# Patient Record
Sex: Female | Born: 1950 | Race: White | Hispanic: No | Smoking: Never smoker
Health system: Southern US, Community
[De-identification: ages and names within clinical notes are randomized; demographics above are authoritative.]

## PROBLEM LIST (undated history)

## (undated) DIAGNOSIS — F329 Major depressive disorder, single episode, unspecified: Secondary | ICD-10-CM

## (undated) DIAGNOSIS — E079 Disorder of thyroid, unspecified: Secondary | ICD-10-CM

## (undated) DIAGNOSIS — M797 Fibromyalgia: Secondary | ICD-10-CM

## (undated) DIAGNOSIS — E559 Vitamin D deficiency, unspecified: Secondary | ICD-10-CM

## (undated) DIAGNOSIS — D649 Anemia, unspecified: Secondary | ICD-10-CM

## (undated) DIAGNOSIS — Z889 Allergy status to unspecified drugs, medicaments and biological substances status: Secondary | ICD-10-CM

## (undated) DIAGNOSIS — E538 Deficiency of other specified B group vitamins: Secondary | ICD-10-CM

## (undated) DIAGNOSIS — R5383 Other fatigue: Secondary | ICD-10-CM

## (undated) DIAGNOSIS — H9190 Unspecified hearing loss, unspecified ear: Secondary | ICD-10-CM

## (undated) DIAGNOSIS — G629 Polyneuropathy, unspecified: Secondary | ICD-10-CM

## (undated) DIAGNOSIS — F419 Anxiety disorder, unspecified: Secondary | ICD-10-CM

## (undated) DIAGNOSIS — F32A Depression, unspecified: Secondary | ICD-10-CM

## (undated) DIAGNOSIS — E039 Hypothyroidism, unspecified: Secondary | ICD-10-CM

## (undated) HISTORY — DX: Vitamin D deficiency, unspecified: E55.9

## (undated) HISTORY — DX: Other fatigue: R53.83

## (undated) HISTORY — DX: Hypothyroidism, unspecified: E03.9

## (undated) HISTORY — DX: Anemia, unspecified: D64.9

## (undated) HISTORY — DX: Fibromyalgia: M79.7

## (undated) HISTORY — DX: Deficiency of other specified B group vitamins: E53.8

## (undated) HISTORY — DX: Anxiety disorder, unspecified: F41.9

## (undated) HISTORY — PX: BACK SURGERY: SHX140

---

## 1998-03-19 ENCOUNTER — Emergency Department (HOSPITAL_COMMUNITY): Admission: EM | Admit: 1998-03-19 | Discharge: 1998-03-19 | Payer: Self-pay | Admitting: Emergency Medicine

## 2005-02-09 ENCOUNTER — Emergency Department (HOSPITAL_COMMUNITY): Admission: EM | Admit: 2005-02-09 | Discharge: 2005-02-09 | Payer: Self-pay | Admitting: Emergency Medicine

## 2006-04-21 IMAGING — CR DG ANKLE COMPLETE 3+V*R*
3 series · 3 of 3 positions shown · non-contrast
Comparison: none

CLINICAL DATA: Fell off ladder and landed on right heel with pain in the right heel and lateral ankle.
 RIGHT ANKLE ? 3 VIEWS:
 Comminuted fracture of the calcaneus, mainly the mid aspect and plantar aspect of the posterior tuberosity.  The fracture line extends to the posterior subtalar joint and plantar surface of the posterior tuberosity.

[t ankle joint ap right]
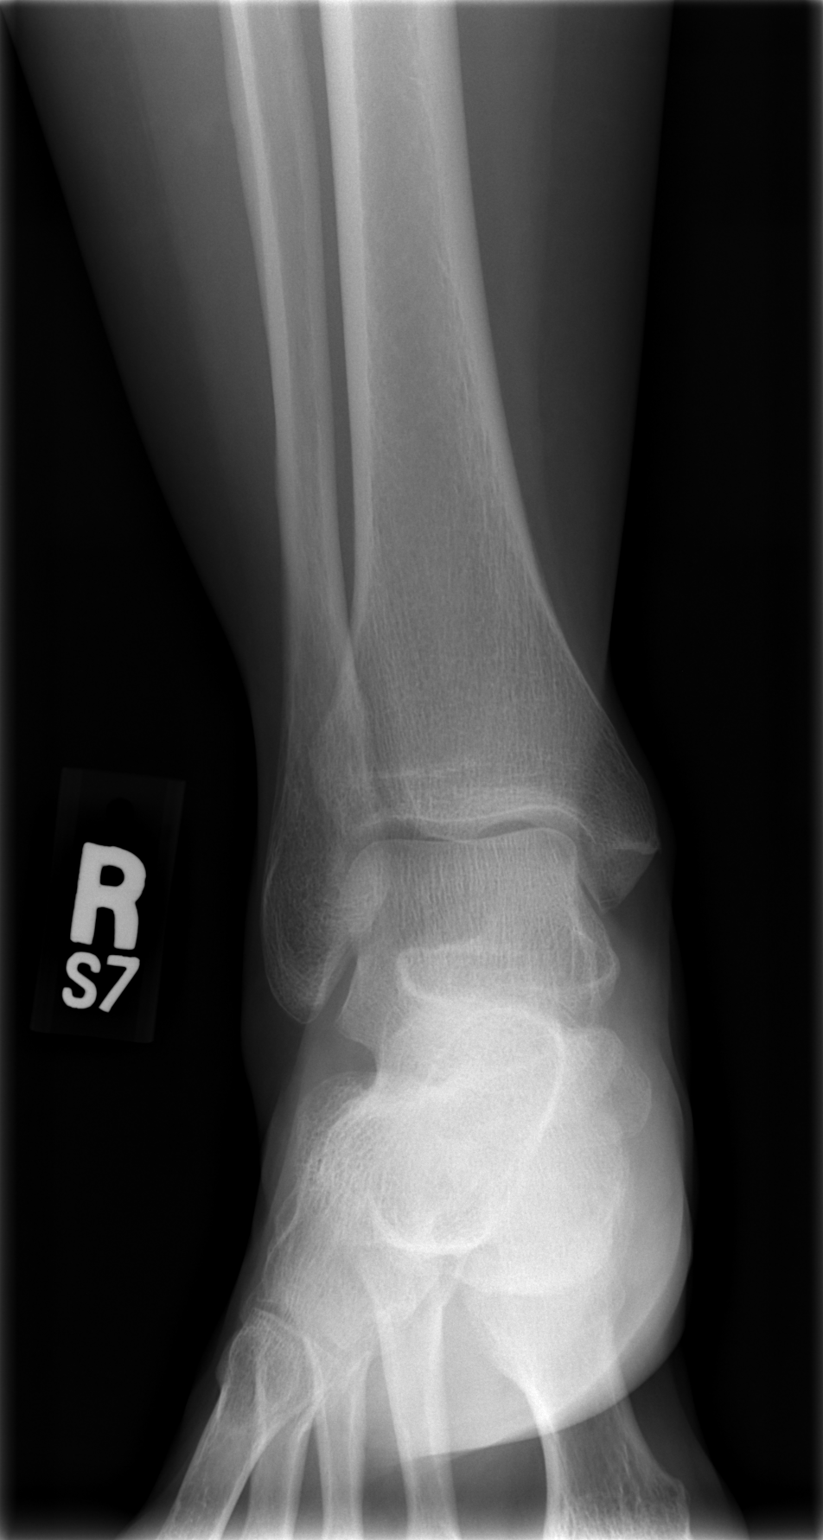

[t ankle joint oblique right]
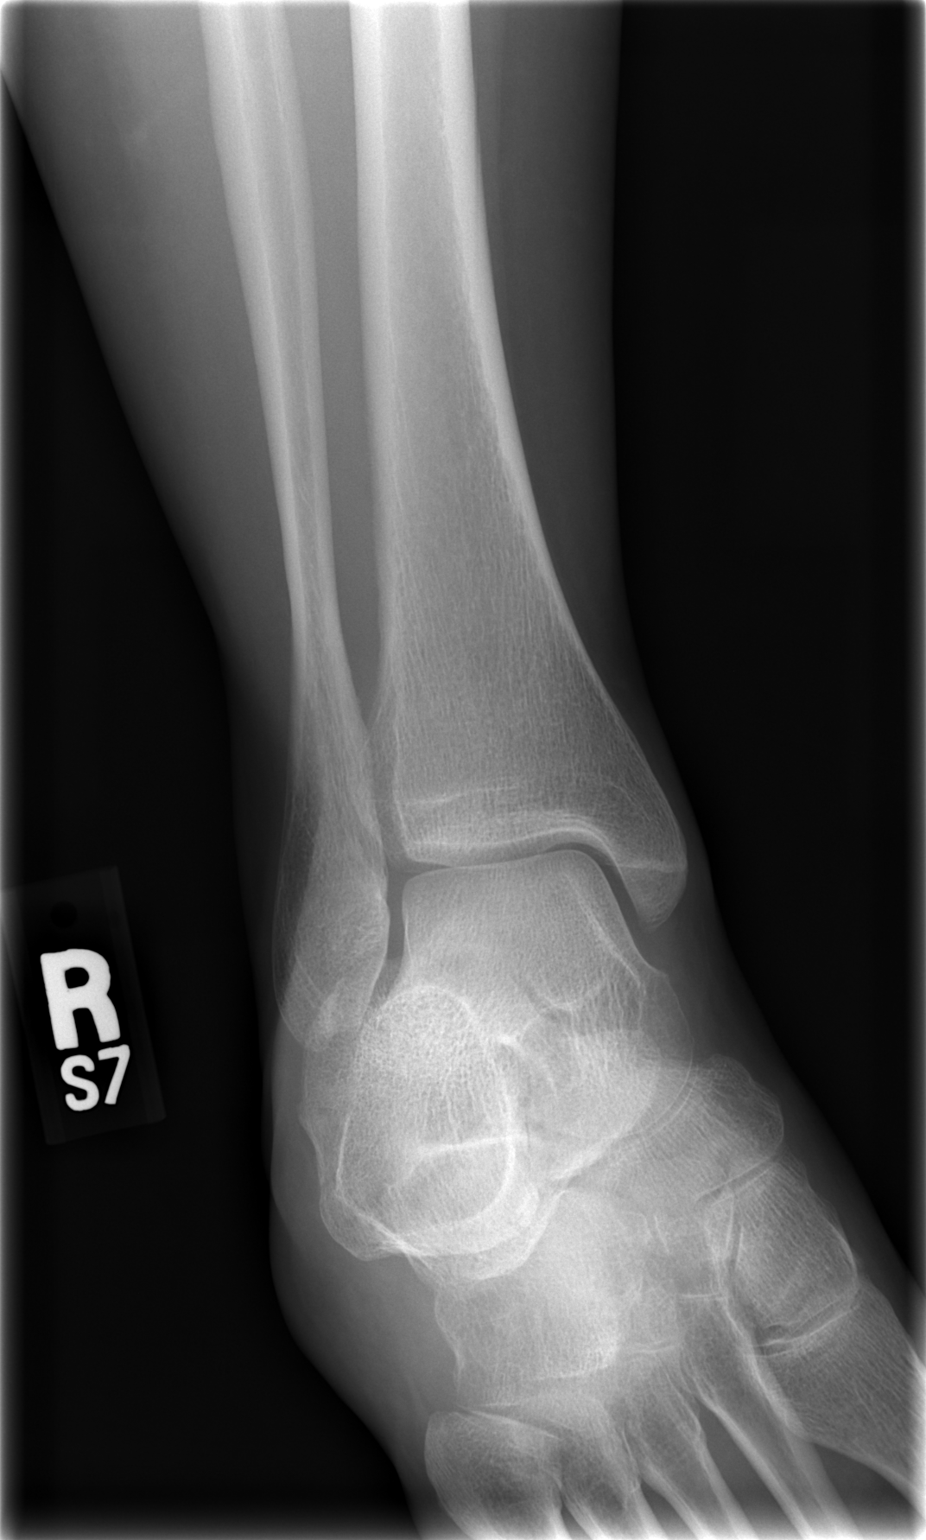

[t ankle joint lat right]
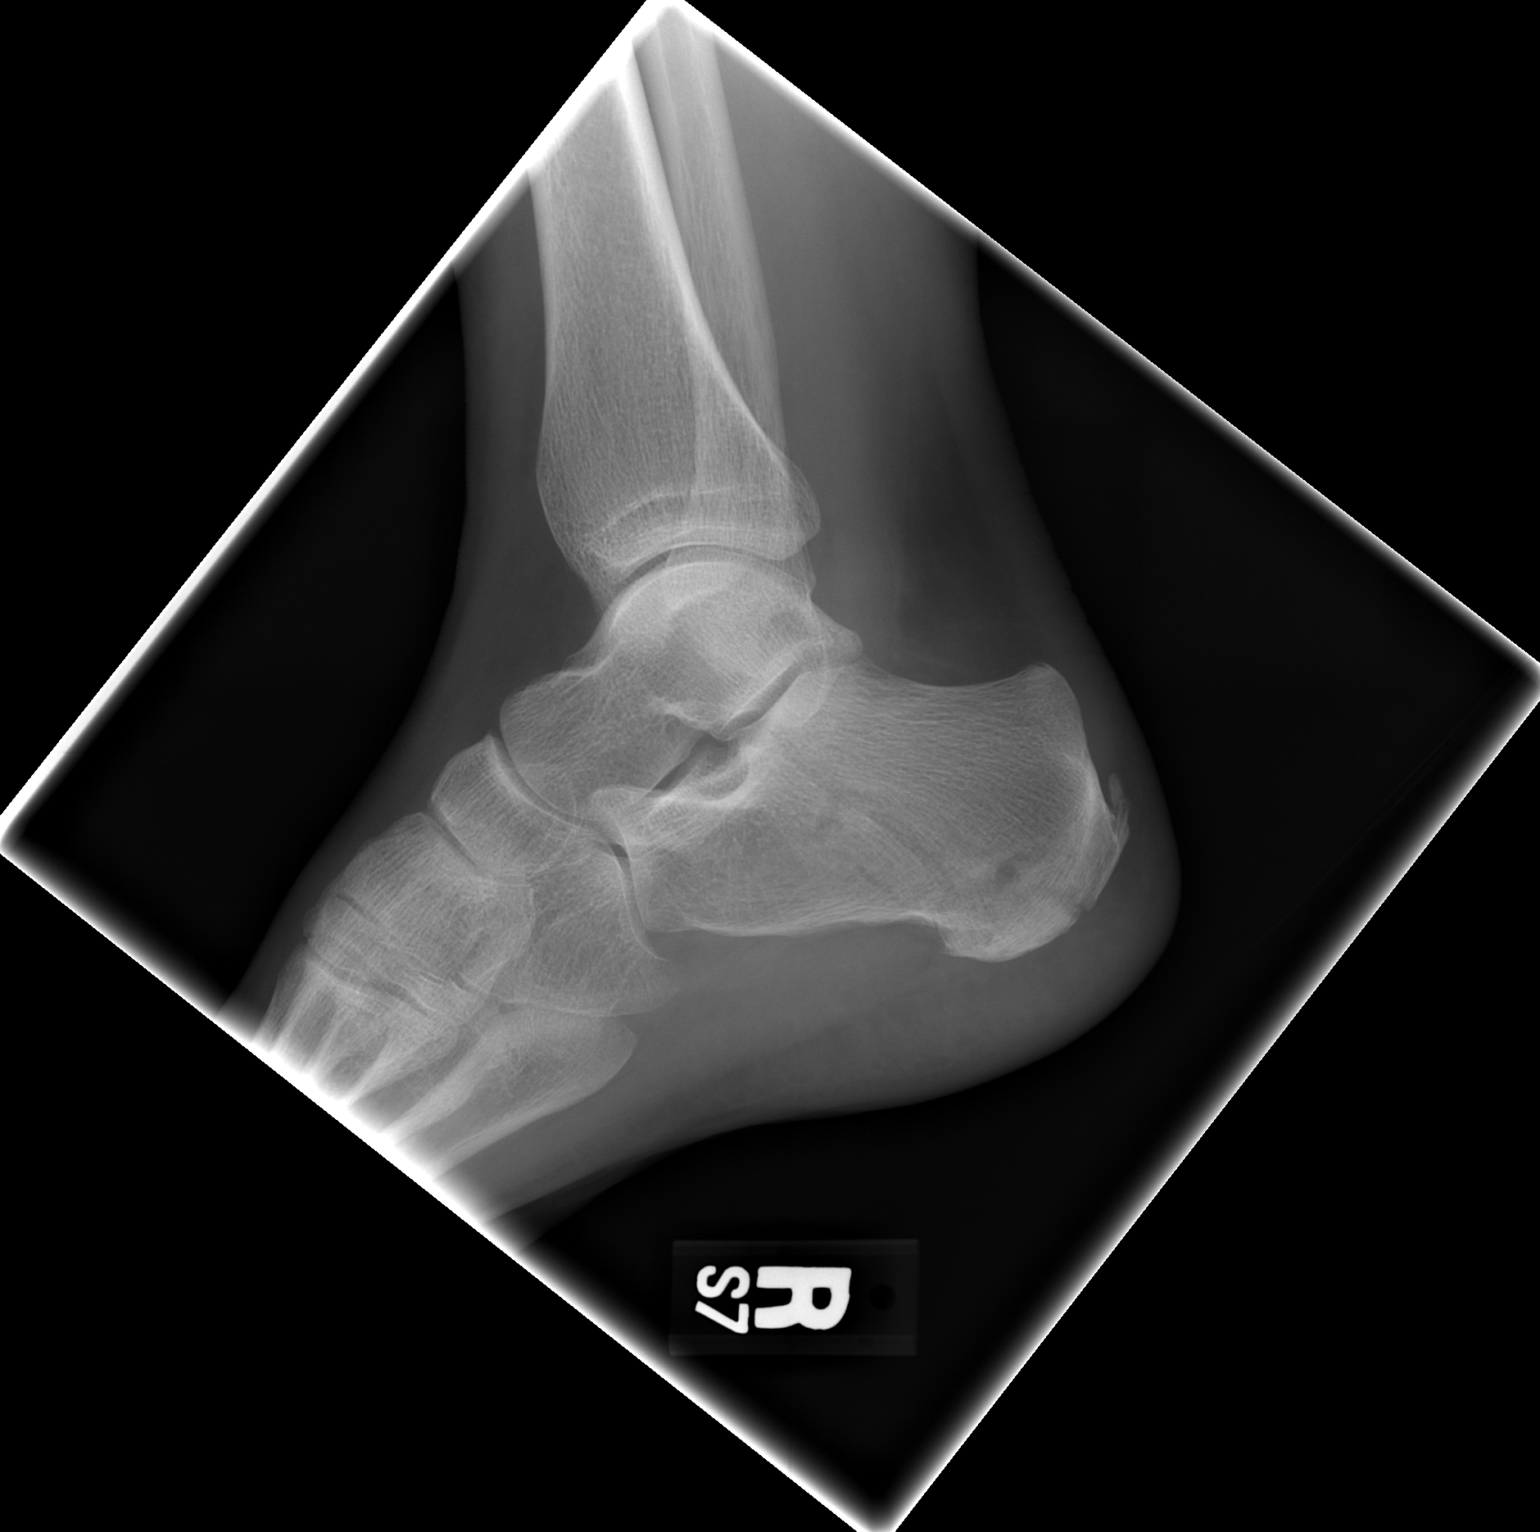

[3 of 3 positions shown; findings below may reference images not displayed]

IMPRESSION: Right calcaneal fracture.

## 2006-04-21 IMAGING — CT CT EXTREM LOW W/O CM*R*
2 of 3 series · 12 of 20 positions shown, 15 images · IV contrast (agent unspecified)
Comparison: none

CLINICAL DATA: Fall, calcaneal fracture. 
CT OF THE RIGHT FOOT WITHOUT CONTRAST:
Axial scans were obtained through the foot with multiplanar reconstruction.  
There is a fracture through the calcaneus.  This is mildly comminuted on the dorsal aspect of the calcaneus but did not significantly displaced.  The fracture extends anteriorly into the subtalar joint but is not significantly displaced.  The ankle joint and the talus are intact.

[Series 5: rt. os calsis 1.0 b80s · axial · 0.29mm/px · z∈[+206,+326]mm · 9 of 301 slices shown, 12 images]
[im 31/301  soft-tissue]
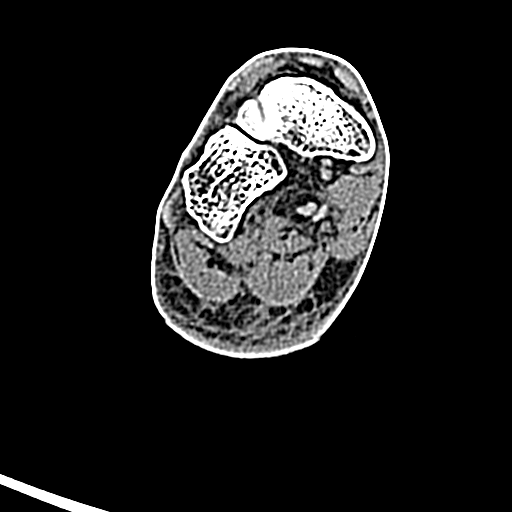
[im 31/301  bone]
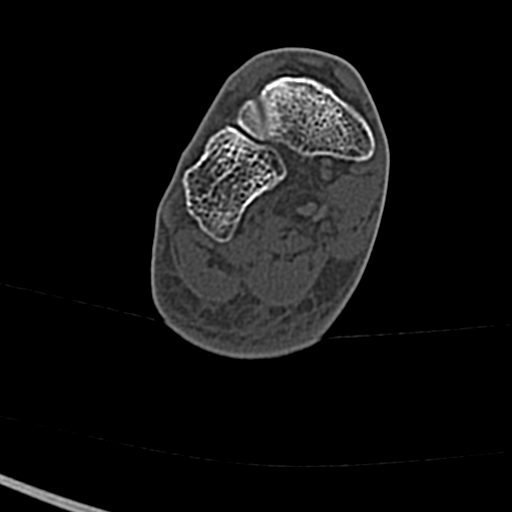
[im 61/301  bone]
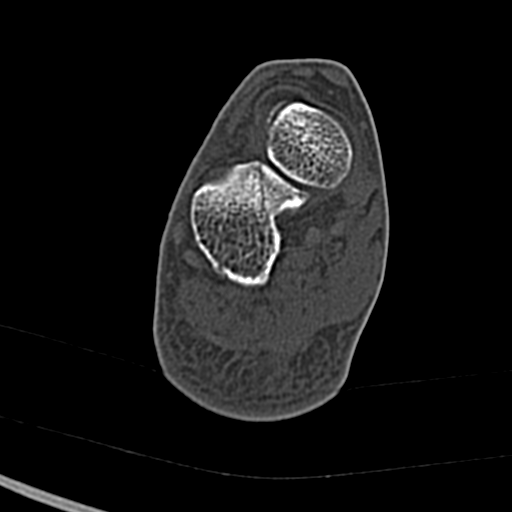
[im 91/301  bone]
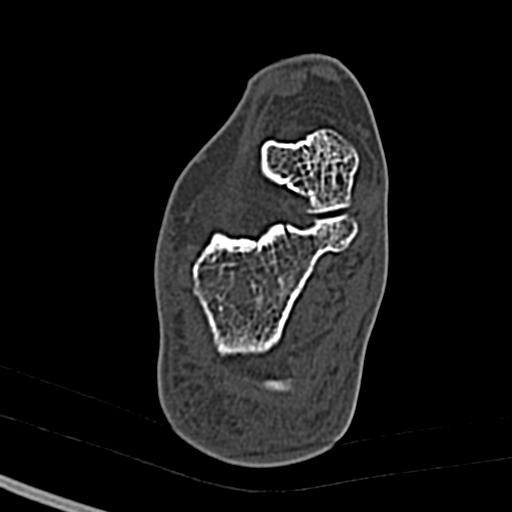
[im 121/301  bone]
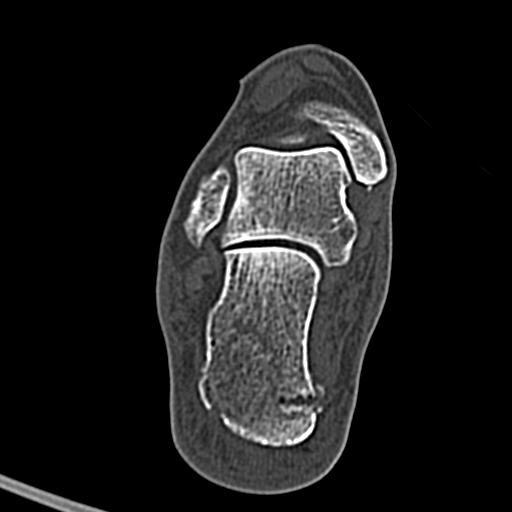
[im 151/301  soft-tissue]
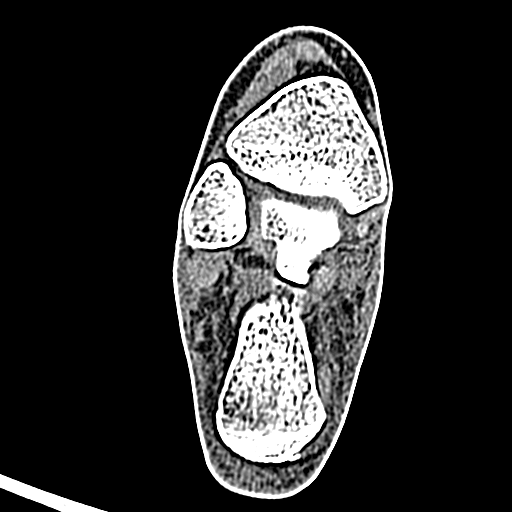
[im 151/301  bone]
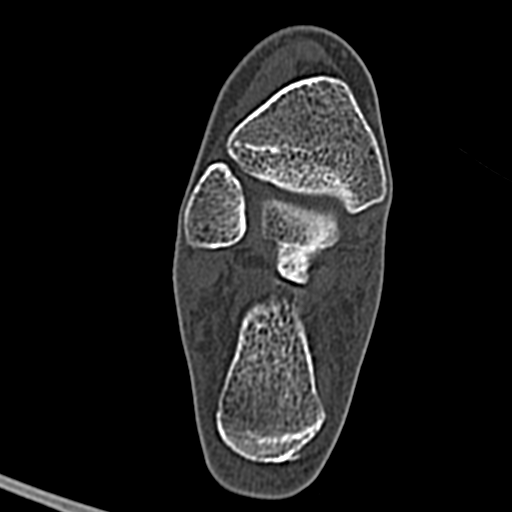
[im 181/301  bone]
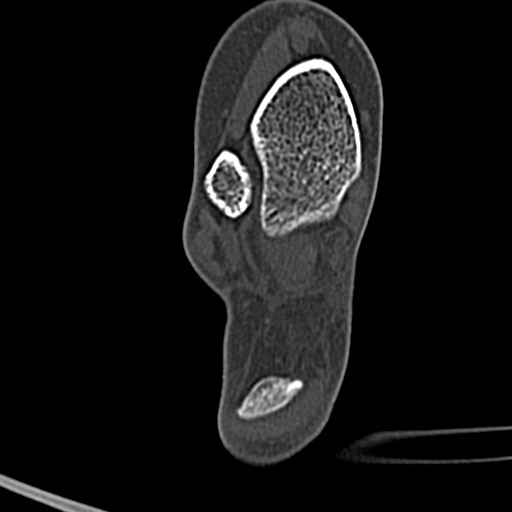
[im 211/301  bone]
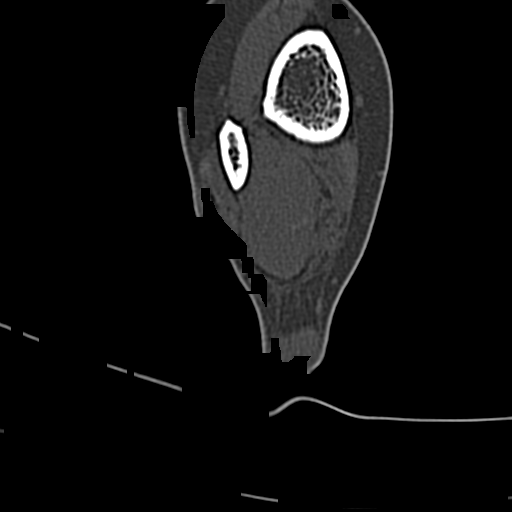
[im 241/301  bone]
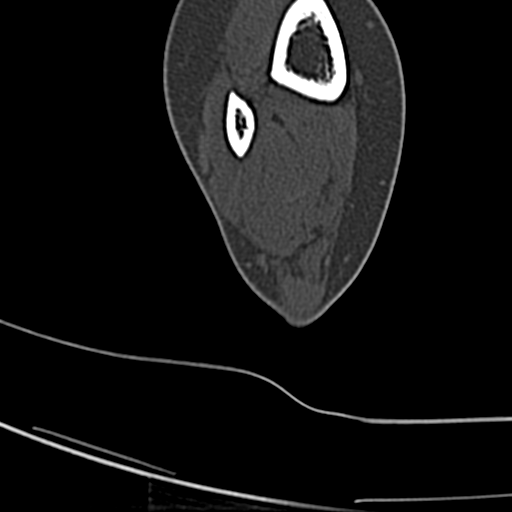
[im 271/301  soft-tissue]
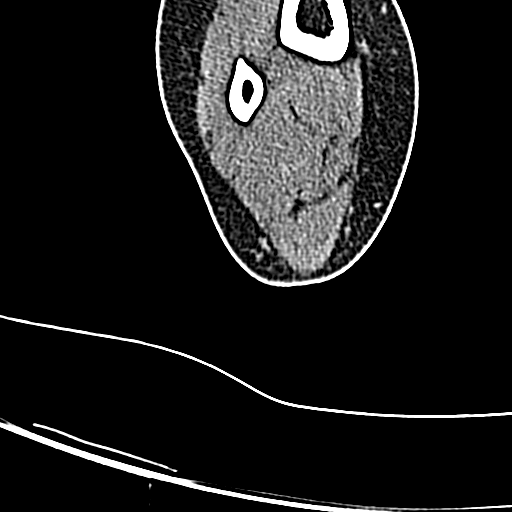
[im 271/301  bone]
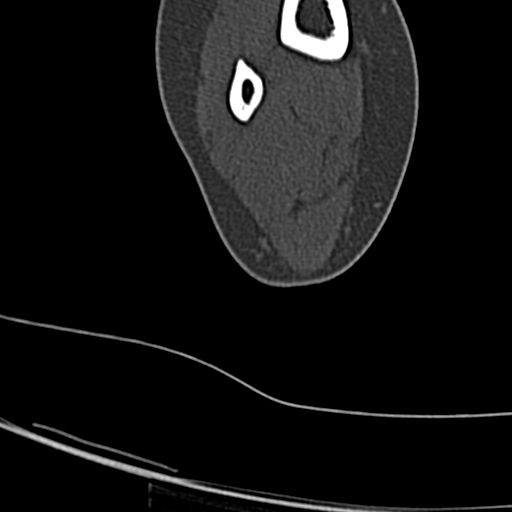

[Series 602: coronal<mpr range> · coronal · 0.38mm/px · 3 of 40 slices shown]
[im 11/40  bone]
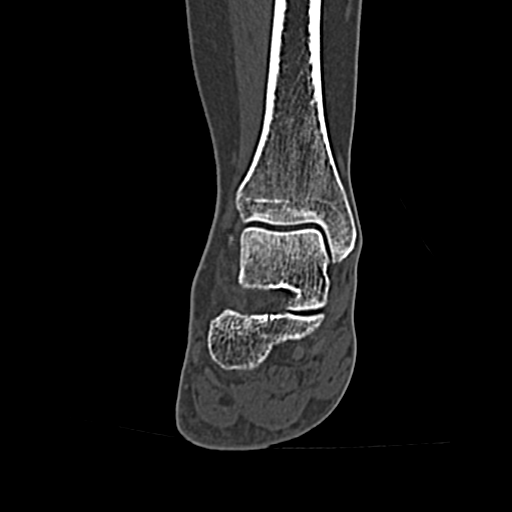
[im 18/40  bone]
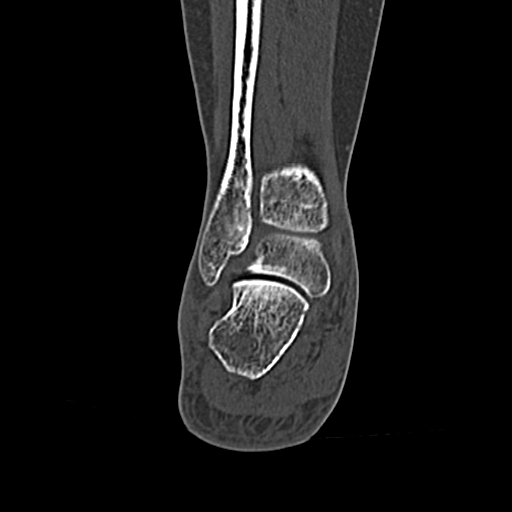
[im 25/40  bone]
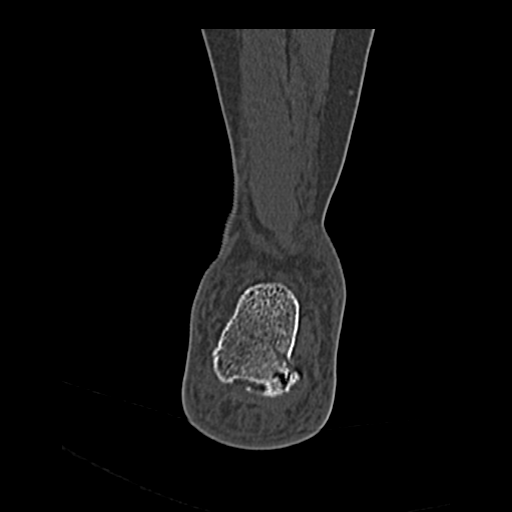

[12 of 20 positions shown; findings below may reference images not displayed]

IMPRESSION: Calcaneal fracture extending into the subtalar joint.  There is a fracture of the dorsal plantar surface of the calcaneus at the plantar fascia insertion.  No significant displacement.

## 2009-04-20 ENCOUNTER — Encounter: Admission: RE | Admit: 2009-04-20 | Discharge: 2009-04-20 | Payer: Self-pay | Admitting: Neurology

## 2009-09-13 ENCOUNTER — Encounter (HOSPITAL_BASED_OUTPATIENT_CLINIC_OR_DEPARTMENT_OTHER): Admission: RE | Admit: 2009-09-13 | Discharge: 2009-12-12 | Payer: Self-pay | Admitting: General Surgery

## 2010-07-17 ENCOUNTER — Emergency Department (HOSPITAL_COMMUNITY): Admission: EM | Admit: 2010-07-17 | Discharge: 2010-07-17 | Payer: Self-pay | Admitting: Emergency Medicine

## 2010-07-21 ENCOUNTER — Encounter: Admission: RE | Admit: 2010-07-21 | Discharge: 2010-07-21 | Payer: Self-pay | Admitting: Family Medicine

## 2010-07-27 ENCOUNTER — Encounter: Admission: RE | Admit: 2010-07-27 | Discharge: 2010-07-27 | Payer: Self-pay | Admitting: Family Medicine

## 2010-08-11 ENCOUNTER — Ambulatory Visit (HOSPITAL_COMMUNITY): Admission: RE | Admit: 2010-08-11 | Discharge: 2010-08-11 | Payer: Self-pay | Admitting: Family Medicine

## 2010-08-17 ENCOUNTER — Ambulatory Visit (HOSPITAL_COMMUNITY)
Admission: RE | Admit: 2010-08-17 | Discharge: 2010-08-17 | Payer: Self-pay | Source: Home / Self Care | Attending: Interventional Radiology | Admitting: Interventional Radiology

## 2010-08-31 ENCOUNTER — Ambulatory Visit (HOSPITAL_COMMUNITY)
Admission: RE | Admit: 2010-08-31 | Discharge: 2010-08-31 | Payer: Self-pay | Source: Home / Self Care | Attending: Interventional Radiology | Admitting: Interventional Radiology

## 2010-10-01 ENCOUNTER — Encounter: Payer: Self-pay | Admitting: Family Medicine

## 2010-11-22 LAB — POCT I-STAT, CHEM 8
BUN: 13 mg/dL (ref 6–23)
Chloride: 105 mEq/L (ref 96–112)
Creatinine, Ser: 1 mg/dL (ref 0.4–1.2)
Potassium: 4.1 mEq/L (ref 3.5–5.1)
Sodium: 140 mEq/L (ref 135–145)

## 2010-11-22 LAB — CBC
Hemoglobin: 12.7 g/dL (ref 12.0–15.0)
MCH: 31 pg (ref 26.0–34.0)
MCV: 92.4 fL (ref 78.0–100.0)
RBC: 4.1 MIL/uL (ref 3.87–5.11)

## 2011-12-01 ENCOUNTER — Ambulatory Visit: Payer: BC Managed Care – PPO | Attending: Neurology | Admitting: Rehabilitative and Restorative Service Providers"

## 2011-12-01 DIAGNOSIS — M47812 Spondylosis without myelopathy or radiculopathy, cervical region: Secondary | ICD-10-CM | POA: Insufficient documentation

## 2011-12-01 DIAGNOSIS — IMO0001 Reserved for inherently not codable concepts without codable children: Secondary | ICD-10-CM | POA: Insufficient documentation

## 2011-12-06 ENCOUNTER — Ambulatory Visit: Payer: BC Managed Care – PPO

## 2011-12-12 ENCOUNTER — Ambulatory Visit: Payer: BC Managed Care – PPO | Attending: Neurology | Admitting: Physical Therapy

## 2011-12-12 DIAGNOSIS — M47812 Spondylosis without myelopathy or radiculopathy, cervical region: Secondary | ICD-10-CM | POA: Insufficient documentation

## 2011-12-12 DIAGNOSIS — IMO0001 Reserved for inherently not codable concepts without codable children: Secondary | ICD-10-CM | POA: Insufficient documentation

## 2011-12-15 ENCOUNTER — Ambulatory Visit: Payer: BC Managed Care – PPO | Admitting: Physical Therapy

## 2011-12-19 ENCOUNTER — Ambulatory Visit: Payer: BC Managed Care – PPO | Admitting: Physical Therapy

## 2011-12-22 ENCOUNTER — Ambulatory Visit: Payer: BC Managed Care – PPO | Admitting: Physical Therapy

## 2011-12-26 ENCOUNTER — Ambulatory Visit: Payer: BC Managed Care – PPO | Admitting: Physical Therapy

## 2011-12-29 ENCOUNTER — Ambulatory Visit: Payer: BC Managed Care – PPO | Admitting: Physical Therapy

## 2012-01-02 ENCOUNTER — Ambulatory Visit: Payer: BC Managed Care – PPO | Admitting: Physical Therapy

## 2012-01-05 ENCOUNTER — Ambulatory Visit: Payer: BC Managed Care – PPO | Admitting: Physical Therapy

## 2012-01-12 ENCOUNTER — Ambulatory Visit: Payer: BC Managed Care – PPO | Attending: Neurology | Admitting: Physical Therapy

## 2012-01-12 DIAGNOSIS — M47812 Spondylosis without myelopathy or radiculopathy, cervical region: Secondary | ICD-10-CM | POA: Insufficient documentation

## 2012-01-12 DIAGNOSIS — IMO0001 Reserved for inherently not codable concepts without codable children: Secondary | ICD-10-CM | POA: Insufficient documentation

## 2012-06-05 ENCOUNTER — Other Ambulatory Visit (INDEPENDENT_AMBULATORY_CARE_PROVIDER_SITE_OTHER): Payer: Self-pay | Admitting: Otolaryngology

## 2012-06-05 DIAGNOSIS — H903 Sensorineural hearing loss, bilateral: Secondary | ICD-10-CM

## 2012-06-11 ENCOUNTER — Ambulatory Visit
Admission: RE | Admit: 2012-06-11 | Discharge: 2012-06-11 | Disposition: A | Discharge status: Home / Self Care | Payer: BC Managed Care – PPO | Source: Ambulatory Visit | Attending: Otolaryngology | Admitting: Otolaryngology

## 2012-06-11 DIAGNOSIS — H903 Sensorineural hearing loss, bilateral: Secondary | ICD-10-CM

## 2012-06-11 MED ORDER — GADOBENATE DIMEGLUMINE 529 MG/ML IV SOLN
16.0000 mL | Freq: Once | INTRAVENOUS | Status: AC | PRN
  Administered 2012-06-11: 16 mL via INTRAVENOUS

## 2012-08-12 ENCOUNTER — Emergency Department (HOSPITAL_COMMUNITY): Payer: BC Managed Care – PPO

## 2012-08-12 ENCOUNTER — Encounter (HOSPITAL_COMMUNITY): Payer: Self-pay | Admitting: Emergency Medicine

## 2012-08-12 ENCOUNTER — Emergency Department (HOSPITAL_COMMUNITY)
Admission: EM | Admit: 2012-08-12 | Discharge: 2012-08-12 | Disposition: A | Discharge status: Home / Self Care | Payer: BC Managed Care – PPO | Attending: Emergency Medicine | Admitting: Emergency Medicine

## 2012-08-12 DIAGNOSIS — M19019 Primary osteoarthritis, unspecified shoulder: Secondary | ICD-10-CM | POA: Insufficient documentation

## 2012-08-12 DIAGNOSIS — F329 Major depressive disorder, single episode, unspecified: Secondary | ICD-10-CM | POA: Insufficient documentation

## 2012-08-12 DIAGNOSIS — R110 Nausea: Secondary | ICD-10-CM | POA: Insufficient documentation

## 2012-08-12 DIAGNOSIS — M549 Dorsalgia, unspecified: Secondary | ICD-10-CM | POA: Insufficient documentation

## 2012-08-12 DIAGNOSIS — Z79899 Other long term (current) drug therapy: Secondary | ICD-10-CM | POA: Insufficient documentation

## 2012-08-12 DIAGNOSIS — M62838 Other muscle spasm: Secondary | ICD-10-CM | POA: Insufficient documentation

## 2012-08-12 DIAGNOSIS — Z8669 Personal history of other diseases of the nervous system and sense organs: Secondary | ICD-10-CM | POA: Insufficient documentation

## 2012-08-12 DIAGNOSIS — M797 Fibromyalgia: Secondary | ICD-10-CM

## 2012-08-12 DIAGNOSIS — E079 Disorder of thyroid, unspecified: Secondary | ICD-10-CM | POA: Insufficient documentation

## 2012-08-12 DIAGNOSIS — F3289 Other specified depressive episodes: Secondary | ICD-10-CM | POA: Insufficient documentation

## 2012-08-12 DIAGNOSIS — IMO0001 Reserved for inherently not codable concepts without codable children: Secondary | ICD-10-CM | POA: Insufficient documentation

## 2012-08-12 HISTORY — DX: Polyneuropathy, unspecified: G62.9

## 2012-08-12 HISTORY — DX: Disorder of thyroid, unspecified: E07.9

## 2012-08-12 HISTORY — DX: Depression, unspecified: F32.A

## 2012-08-12 HISTORY — DX: Unspecified hearing loss, unspecified ear: H91.90

## 2012-08-12 HISTORY — DX: Allergy status to unspecified drugs, medicaments and biological substances: Z88.9

## 2012-08-12 HISTORY — DX: Major depressive disorder, single episode, unspecified: F32.9

## 2012-08-12 MED ORDER — KETOROLAC TROMETHAMINE 60 MG/2ML IM SOLN
60.0000 mg | Freq: Once | INTRAMUSCULAR | Status: AC
  Administered 2012-08-12: 60 mg via INTRAMUSCULAR
  Filled 2012-08-12: qty 2

## 2012-08-12 MED ORDER — KETOROLAC TROMETHAMINE 10 MG PO TABS: 10.0000 mg | ORAL_TABLET | Freq: Four times a day (QID) | ORAL | Status: DC | PRN

## 2012-08-12 MED ORDER — METHOCARBAMOL 500 MG PO TABS: 500.0000 mg | ORAL_TABLET | Freq: Two times a day (BID) | ORAL | Status: AC

## 2012-08-12 MED ORDER — DIAZEPAM 5 MG PO TABS
10.0000 mg | ORAL_TABLET | Freq: Once | ORAL | Status: AC
  Administered 2012-08-12: 10 mg via ORAL
  Filled 2012-08-12: qty 2

## 2012-08-12 NOTE — ED Provider Notes (Signed)
History     CSN: [id number]  Arrival date & time 08/12/12  1306   First MD Initiated Contact with Patient 08/12/12 1605      Chief Complaint  Patient presents with  . Shoulder Pain    (Consider location/radiation/quality/duration/timing/severity/associated sxs/prior treatment) Patient is a 61 y.o. female presenting with shoulder pain. The history is provided by the patient and medical records.  Shoulder Pain Associated symptoms include arthralgias and nausea. Pertinent negatives include no abdominal pain, chest pain, coughing, diaphoresis, fatigue, fever, headaches, rash or vomiting.    NYRA DUSSEAULT is a 61 y.o. female  with a hx of neuropathy, fibromyalgia, depression presents to the Emergency Department complaining of unknown onset, persistent, progressively worsening  Right shoulder pain onset this morning upon waking from sleep. Pt describes pain as sharp, localized in the right shoulder blade, 10/10, and non-radiating.  Pt states it hurts worse then when she broke her back.  Associated symptoms include decreased ROM 2/2 pain, nausea.  Nothing makes it better and movement and palpation makes it worse.  Pt denies fever, chills, headache, chest pain, shortness of breath, abdominal pain, vomiting, diarrhea, weakness, dizziness, numbness, syncope.  Pt with fibromyalgia and chronic neck, shoulder pain, but this shoulder pain is different.    Past Medical History  Diagnosis Date  . Depression   . Thyroid disease   . H/O seasonal allergies   . Neuropathy   . Hearing loss     Past Surgical History  Procedure Date  . Back surgery     No family history on file.  History  Substance Use Topics  . Smoking status: Never Smoker   . Smokeless tobacco: Not on file  . Alcohol Use: No    OB History    Grav Para Term Preterm Abortions TAB SAB Ect Mult Living                  Review of Systems  Constitutional: Negative for fever, diaphoresis, appetite change, fatigue and  unexpected weight change.  HENT: Negative for mouth sores and neck stiffness.   Eyes: Negative for visual disturbance.  Respiratory: Negative for cough, chest tightness, shortness of breath and wheezing.   Cardiovascular: Negative for chest pain.  Gastrointestinal: Positive for nausea. Negative for vomiting, abdominal pain, diarrhea and constipation.  Genitourinary: Negative for dysuria, urgency, frequency and hematuria.  Musculoskeletal: Positive for back pain and arthralgias. Negative for gait problem.  Skin: Negative for rash.  Neurological: Negative for syncope, light-headedness and headaches.  Psychiatric/Behavioral: Negative for sleep disturbance. The patient is not nervous/anxious.   All other systems reviewed and are negative.    Allergies  Hydrocodone and Penicillins  Home Medications   Current Outpatient Rx  Name  Route  Sig  Dispense  Refill  . ALPRAZOLAM 0.5 MG PO TABS   Oral   Take 0.5 mg by mouth daily as needed. Anxiety         . BUPROPION HCL ER (XL) 150 MG PO TB24   Oral   Take 150 mg by mouth daily.         . MOMETASONE FUROATE 50 MCG/ACT NA SUSP   Nasal   Place 2 sprays into the nose daily.         . SERTRALINE HCL 100 MG PO TABS   Oral   Take 100 mg by mouth daily.         . THYROID 60 MG PO TABS   Oral   Take 60 mg by  mouth daily.         Marland Kitchen KETOROLAC TROMETHAMINE 10 MG PO TABS   Oral   Take 1 tablet (10 mg total) by mouth every 6 (six) hours as needed for pain.   20 tablet   0   . METHOCARBAMOL 500 MG PO TABS   Oral   Take 1 tablet (500 mg total) by mouth 2 (two) times daily.   20 tablet   0     BP 149/88  Pulse 67  Temp 98.1 F (36.7 C) (Oral)  Resp 20  SpO2 100%  Physical Exam  Nursing note and vitals reviewed. Constitutional: She appears well-developed and well-nourished. No distress.  HENT:  Head: Normocephalic and atraumatic.  Mouth/Throat: Oropharynx is clear and moist. No oropharyngeal exudate.  Eyes:  Conjunctivae normal are normal. No scleral icterus.  Neck: Normal range of motion. Neck supple.  Cardiovascular: Normal rate, regular rhythm, normal heart sounds and intact distal pulses.  Exam reveals no gallop and no friction rub.   No murmur heard.      Capillary refill less than 3 seconds  Pulmonary/Chest: Effort normal and breath sounds normal. No respiratory distress. She has no wheezes. She has no rales. She exhibits no tenderness.  Abdominal: Bowel sounds are normal.  Musculoskeletal: Normal range of motion. She exhibits tenderness. She exhibits no edema.       Arms:       No tenderness to the spinous processes or paraspinal muscles of the T-spine or L-spine.  Pain to palpation of the muscles surrounding the right scapula  Full passive range of motion of the right shoulder, decreased active range of motion secondary to pain   Full passive and active range of motion of the right elbow wrist and fingers  Lymphadenopathy:    She has no cervical adenopathy.  Neurological: She is alert. She has normal reflexes. She displays no atrophy and no tremor. No sensory deficit. She exhibits normal muscle tone. Coordination and gait normal. GCS eye subscore is 4. GCS verbal subscore is 5. GCS motor subscore is 6.  Reflex Scores:      Tricep reflexes are 2+ on the right side and 2+ on the left side.      Bicep reflexes are 2+ on the right side and 2+ on the left side.      Brachioradialis reflexes are 2+ on the right side and 2+ on the left side.      Patellar reflexes are 2+ on the right side and 2+ on the left side.      Achilles reflexes are 2+ on the right side and 2+ on the left side.      Speech is clear and goal oriented, follows commands Normal strength in upper and lower extremities bilaterally including dorsiflexion and plantar flexion, strong and equal grip strength Sensation normal to light and sharp touch Moves extremities without ataxia, coordination intact Normal gait and balance   Skin: Skin is warm and dry. She is not diaphoretic.  Psychiatric: She has a normal mood and affect.    ED Course  Procedures (including critical care time)  Labs Reviewed - No data to display Dg Shoulder Right  08/12/2012  *RADIOLOGY REPORT*  Clinical Data: Shoulder pain  RIGHT SHOULDER - 2+ VIEW  Comparison: None.  Findings: Three views of the right shoulder submitted.  No acute fracture or subluxation.  Mild degenerative changes right AC joint.  IMPRESSION: No acute fracture or subluxation.  Mild degenerative changes right AC joint.  Original Report Authenticated By: Natasha Mead, M.D.      1. AC (acromioclavicular) joint arthritis   2. Muscle spasm of right shoulder   3. Fibromyalgia       MDM  Kingsley Callander presents with nontraumatic right back/scaplula pain.  Based on history and physical likely secondary to muscle spasm of the back.  Patient with fibromyalgia is considered but this may be part of a fibromyalgia flare.   X-rays without evidence of fracture or subluxation.   Mild degenerative changes of the right a.c. joint is noted.   Patient significantly improved after muscle relaxer and pain medication with increase range of motion to her right shoulder. At this time there does not appear to be any evidence of an acute emergency medical condition and the patient appears stable for discharge with appropriate outpatient follow up.  I discussed the importance of following up with her primary care physician. She has placed a call to him while here in the department and he has agreed to work her in early this week for further evaluation and management of her shoulder pain. Diagnosis was discussed with patient who verbalizes understanding and is agreeable to discharge.    1. Medications: Robaxin, ketorolac, usual home medications  2. Treatment: rest, drink plenty of fluids, RICE  3. Follow Up: Please followup with your primary doctor for discussion of your diagnoses and further  evaluation after today's visit     Dierdre Forth, PA-C 08/12/12 1802

## 2012-08-12 NOTE — ED Notes (Signed)
Pt presenting to ed with c/o right shoulder pain and upper right side back pain. Pt denies chest pain at this time. Pt denies nausea and vomiting at this time.

## 2012-08-14 NOTE — ED Provider Notes (Signed)
Medical screening examination/treatment/procedure(s) were performed by non-physician practitioner and as supervising physician I was immediately available for consultation/collaboration.   Avraj Lindroth W Muhannad Bignell, MD 08/14/12 0039 

## 2014-06-16 ENCOUNTER — Emergency Department (HOSPITAL_COMMUNITY)
Admission: EM | Admit: 2014-06-16 | Discharge: 2014-06-17 | Disposition: A | Discharge status: Home / Self Care | Payer: Medicare Other | Attending: Emergency Medicine | Admitting: Emergency Medicine

## 2014-06-16 DIAGNOSIS — Z880 Allergy status to penicillin: Secondary | ICD-10-CM | POA: Insufficient documentation

## 2014-06-16 DIAGNOSIS — Z7952 Long term (current) use of systemic steroids: Secondary | ICD-10-CM | POA: Diagnosis not present

## 2014-06-16 DIAGNOSIS — E039 Hypothyroidism, unspecified: Secondary | ICD-10-CM | POA: Diagnosis not present

## 2014-06-16 DIAGNOSIS — R319 Hematuria, unspecified: Secondary | ICD-10-CM | POA: Diagnosis present

## 2014-06-16 DIAGNOSIS — H9190 Unspecified hearing loss, unspecified ear: Secondary | ICD-10-CM | POA: Diagnosis not present

## 2014-06-16 DIAGNOSIS — F329 Major depressive disorder, single episode, unspecified: Secondary | ICD-10-CM | POA: Diagnosis not present

## 2014-06-16 DIAGNOSIS — N390 Urinary tract infection, site not specified: Secondary | ICD-10-CM

## 2014-06-16 DIAGNOSIS — Z79899 Other long term (current) drug therapy: Secondary | ICD-10-CM | POA: Insufficient documentation

## 2014-06-17 ENCOUNTER — Encounter (HOSPITAL_COMMUNITY): Payer: Self-pay | Admitting: Emergency Medicine

## 2014-06-17 LAB — URINALYSIS, ROUTINE W REFLEX MICROSCOPIC
BILIRUBIN URINE: NEGATIVE
Glucose, UA: NEGATIVE mg/dL
Ketones, ur: NEGATIVE mg/dL
Nitrite: POSITIVE — AB
Protein, ur: 100 mg/dL — AB
SPECIFIC GRAVITY, URINE: 1.017 (ref 1.005–1.030)
Urobilinogen, UA: 0.2 mg/dL (ref 0.0–1.0)
pH: 6.5 (ref 5.0–8.0)

## 2014-06-17 LAB — URINE MICROSCOPIC-ADD ON

## 2014-06-17 MED ORDER — PHENAZOPYRIDINE HCL 200 MG PO TABS: 200.0000 mg | ORAL_TABLET | Freq: Three times a day (TID) | ORAL | Status: DC

## 2014-06-17 MED ORDER — SULFAMETHOXAZOLE-TMP DS 800-160 MG PO TABS
1.0000 | ORAL_TABLET | Freq: Once | ORAL | Status: AC
  Administered 2014-06-17: 1 via ORAL
  Filled 2014-06-17: qty 1

## 2014-06-17 MED ORDER — SULFAMETHOXAZOLE-TRIMETHOPRIM 800-160 MG PO TABS: 1.0000 | ORAL_TABLET | Freq: Two times a day (BID) | ORAL | Status: DC

## 2014-06-17 NOTE — ED Notes (Signed)
Pt sts that around 9pm last night she began "having symptoms of a UTI." Pt c/o suprapubic pressure, hematuria, and dysuria. Pt c/o 10/10 pain when urinating and sts that when she isn't urinating she just feels pressure. Pt sts she came in because she knew she couldn't sleep like this. A&Ox4. Ambulatory to triage room.

## 2014-06-17 NOTE — ED Notes (Signed)
Patient with no complaints at this time. Respirations even and unlabored. Skin warm/dry. Discharge instructions reviewed with patient at this time. Patient given opportunity to voice concerns/ask questions. Patient discharged at this time and left Emergency Department with steady gait.   

## 2014-06-17 NOTE — Discharge Instructions (Signed)
Take bactrim as directed until gone. Take pyridium as needed for urinary discomfort. Refer to attached documents for more information. Follow up with your primary care provider as needed.

## 2014-06-17 NOTE — ED Provider Notes (Signed)
CSN: [id number]     Arrival date & time 06/16/14  2330 History   First MD Initiated Contact with Patient 06/17/14 0241     Chief Complaint  Patient presents with  . Urinary Tract Infection  . Hematuria     (Consider location/radiation/quality/duration/timing/severity/associated sxs/prior Treatment) Patient is a 63 y.o. female presenting with urinary tract infection and hematuria. The history is provided by the patient. No language interpreter was used.  Urinary Tract Infection This is a new problem. The current episode started today. The problem occurs constantly. The problem has been unchanged. Pertinent negatives include no arthralgias, chest pain, chills, fatigue, fever, nausea, neck pain, vomiting or weakness. Exacerbated by: urinating. She has tried nothing for the symptoms. The treatment provided no relief.  Hematuria This is a new problem. The current episode started today. The problem occurs constantly. The problem has been unchanged. Pertinent negatives include no arthralgias, chest pain, chills, fatigue, fever, nausea, neck pain, vomiting or weakness. Nothing aggravates the symptoms. She has tried nothing for the symptoms. The treatment provided no relief.    Past Medical History  Diagnosis Date  . Depression   . Thyroid disease   . H/O seasonal allergies   . Neuropathy   . Hearing loss    Past Surgical History  Procedure Laterality Date  . Back surgery     No family history on file. History  Substance Use Topics  . Smoking status: Never Smoker   . Smokeless tobacco: Not on file  . Alcohol Use: No   OB History   Grav Para Term Preterm Abortions TAB SAB Ect Mult Living                 Review of Systems  Constitutional: Negative for fever, chills and fatigue.  HENT: Negative for trouble swallowing.   Eyes: Negative for visual disturbance.  Respiratory: Negative for shortness of breath.   Cardiovascular: Negative for chest pain and palpitations.   Gastrointestinal: Negative for nausea, vomiting and diarrhea.  Genitourinary: Positive for dysuria and hematuria. Negative for difficulty urinating.  Musculoskeletal: Negative for arthralgias and neck pain.  Skin: Negative for color change.  Neurological: Negative for dizziness and weakness.  Psychiatric/Behavioral: Negative for dysphoric mood.      Allergies  Hydrocodone and Penicillins  Home Medications   Prior to Admission medications   Medication Sig Start Date End Date Taking? Authorizing Provider  ALPRAZolam Prudy Feeler(XANAX) 0.5 MG tablet Take 0.5 mg by mouth daily as needed. Anxiety    Historical Provider, MD  buPROPion (WELLBUTRIN XL) 150 MG 24 hr tablet Take 150 mg by mouth daily.    Historical Provider, MD  ketorolac (TORADOL) 10 MG tablet Take 1 tablet (10 mg total) by mouth every 6 (six) hours as needed for pain. 08/12/12   Hannah Muthersbaugh, PA-C  methocarbamol (ROBAXIN) 500 MG tablet Take 1 tablet (500 mg total) by mouth 2 (two) times daily. 08/12/12   Hannah Muthersbaugh, PA-C  mometasone (NASONEX) 50 MCG/ACT nasal spray Place 2 sprays into the nose daily.    Historical Provider, MD  sertraline (ZOLOFT) 100 MG tablet Take 100 mg by mouth daily.    Historical Provider, MD  thyroid (ARMOUR) 60 MG tablet Take 60 mg by mouth daily.    Historical Provider, MD   BP 153/82  Pulse 97  Temp(Src) 98.4 F (36.9 C) (Oral)  Resp 16  SpO2 98% Physical Exam  Nursing note and vitals reviewed. Constitutional: She is oriented to person, place, and time. She appears well-developed  and well-nourished. No distress.  HENT:  Head: Normocephalic and atraumatic.  Eyes: Conjunctivae are normal.  Neck: Normal range of motion.  Cardiovascular: Normal rate and regular rhythm.  Exam reveals no gallop and no friction rub.   No murmur heard. Pulmonary/Chest: Effort normal and breath sounds normal. She has no wheezes. She has no rales. She exhibits no tenderness.  Abdominal: Soft. She exhibits no  distension. There is tenderness. There is no rebound and no guarding.  Suprapubic tenderness to palpation. No other focal tenderness.   Genitourinary:  No CVA tenderness to palpation.   Musculoskeletal: Normal range of motion.  Neurological: She is alert and oriented to person, place, and time. Coordination normal.  Speech is goal-oriented. Moves limbs without ataxia.   Skin: Skin is warm and dry.  Psychiatric: She has a normal mood and affect. Her behavior is normal.    ED Course  Procedures (including critical care time) Labs Review Labs Reviewed  URINALYSIS, ROUTINE W REFLEX MICROSCOPIC - Abnormal; Notable for the following:    Color, Urine RED (*)    APPearance CLOUDY (*)    Hgb urine dipstick LARGE (*)    Protein, ur 100 (*)    Nitrite POSITIVE (*)    Leukocytes, UA LARGE (*)    All other components within normal limits  URINE MICROSCOPIC-ADD ON - Abnormal; Notable for the following:    Bacteria, UA FEW (*)    All other components within normal limits    Imaging Review No results found.   EKG Interpretation None      MDM   Final diagnoses:  UTI (lower urinary tract infection)    2:45 AM Urinalysis shows UTI. Patient's vitals stable and patient afebrile. Patient has no flank pain or other associated symptoms. Patient will have Bactrim prescription. Patient given return precautions.     Emilia Beck, PA-C 06/17/14 [phone]

## 2014-06-18 NOTE — ED Provider Notes (Signed)
Medical screening examination/treatment/procedure(s) were performed by non-physician practitioner and as supervising physician I was immediately available for consultation/collaboration.   EKG Interpretation None       Chamika Cunanan, MD 06/18/14 2303 

## 2015-05-07 ENCOUNTER — Other Ambulatory Visit: Payer: Self-pay | Admitting: Physician Assistant

## 2015-05-07 DIAGNOSIS — R609 Edema, unspecified: Secondary | ICD-10-CM

## 2015-05-12 ENCOUNTER — Ambulatory Visit
Admission: RE | Admit: 2015-05-12 | Discharge: 2015-05-12 | Disposition: A | Discharge status: Home / Self Care | Payer: Medicare Other | Source: Ambulatory Visit | Attending: Physician Assistant | Admitting: Physician Assistant

## 2015-05-12 DIAGNOSIS — R609 Edema, unspecified: Secondary | ICD-10-CM

## 2015-05-12 MED ORDER — GADOBENATE DIMEGLUMINE 529 MG/ML IV SOLN
17.0000 mL | Freq: Once | INTRAVENOUS | Status: AC | PRN
  Administered 2015-05-12: 17 mL via INTRAVENOUS

## 2015-06-18 ENCOUNTER — Ambulatory Visit (INDEPENDENT_AMBULATORY_CARE_PROVIDER_SITE_OTHER): Payer: Medicare Other | Admitting: Neurology

## 2015-06-18 ENCOUNTER — Encounter: Payer: Self-pay | Admitting: Neurology

## 2015-06-18 VITALS — BP 117/73 | HR 76 | Ht 62.75 in | Wt 180.0 lb

## 2015-06-18 DIAGNOSIS — G4730 Sleep apnea, unspecified: Secondary | ICD-10-CM

## 2015-06-18 DIAGNOSIS — R51 Headache: Secondary | ICD-10-CM

## 2015-06-18 DIAGNOSIS — R519 Headache, unspecified: Secondary | ICD-10-CM | POA: Insufficient documentation

## 2015-06-18 DIAGNOSIS — G441 Vascular headache, not elsewhere classified: Secondary | ICD-10-CM | POA: Diagnosis not present

## 2015-06-18 NOTE — Patient Instructions (Signed)
Sleep Apnea  Sleep apnea is a sleep disorder characterized by abnormal pauses in breathing while you sleep. When your breathing pauses, the level of oxygen in your blood decreases. This causes you to move out of deep sleep and into light sleep. As a result, your quality of sleep is poor, and the system that carries your blood throughout your body (cardiovascular system) experiences stress. If sleep apnea remains untreated, the following conditions can develop:  High blood pressure (hypertension).  Coronary artery disease.  Inability to achieve or maintain an erection (impotence).  Impairment of your thought process (cognitive dysfunction). There are three types of sleep apnea: 1. Obstructive sleep apnea--Pauses in breathing during sleep because of a blocked airway. 2. Central sleep apnea--Pauses in breathing during sleep because the area of the brain that controls your breathing does not send the correct signals to the muscles that control breathing. 3. Mixed sleep apnea--A combination of both obstructive and central sleep apnea. RISK FACTORS The following risk factors can increase your risk of developing sleep apnea:  Being overweight.  Smoking.  Having narrow passages in your nose and throat.  Being of older age.  Being female.  Alcohol use.  Sedative and tranquilizer use.  Ethnicity. Among individuals younger than 35 years, African Americans are at increased risk of sleep apnea. SYMPTOMS   Difficulty staying asleep.  Daytime sleepiness and fatigue.  Loss of energy.  Irritability.  Loud, heavy snoring.  Morning headaches.  Trouble concentrating.  Forgetfulness.  Decreased interest in sex.  Unexplained sleepiness. DIAGNOSIS  In order to diagnose sleep apnea, your caregiver will perform a physical examination. A sleep study done in the comfort of your own home may be appropriate if you are otherwise healthy. Your caregiver may also recommend that you spend the  night in a sleep lab. In the sleep lab, several monitors record information about your heart, lungs, and brain while you sleep. Your leg and arm movements and blood oxygen level are also recorded. TREATMENT The following actions may help to resolve mild sleep apnea:  Sleeping on your side.   Using a decongestant if you have nasal congestion.   Avoiding the use of depressants, including alcohol, sedatives, and narcotics.   Losing weight and modifying your diet if you are overweight. There also are devices and treatments to help open your airway:  Oral appliances. These are custom-made mouthpieces that shift your lower jaw forward and slightly open your bite. This opens your airway.  Devices that create positive airway pressure. This positive pressure "splints" your airway open to help you breathe better during sleep. The following devices create positive airway pressure:  Continuous positive airway pressure (CPAP) device. The CPAP device creates a continuous level of air pressure with an air pump. The air is delivered to your airway through a mask while you sleep. This continuous pressure keeps your airway open.  Nasal expiratory positive airway pressure (EPAP) device. The EPAP device creates positive air pressure as you exhale. The device consists of single-use valves, which are inserted into each nostril and held in place by adhesive. The valves create very little resistance when you inhale but create much more resistance when you exhale. That increased resistance creates the positive airway pressure. This positive pressure while you exhale keeps your airway open, making it easier to breath when you inhale again.  Bilevel positive airway pressure (BPAP) device. The BPAP device is used mainly in patients with central sleep apnea. This device is similar to the CPAP device because   it also uses an air pump to deliver continuous air pressure through a mask. However, with the BPAP machine, the  pressure is set at two different levels. The pressure when you exhale is lower than the pressure when you inhale.  Surgery. Typically, surgery is only done if you cannot comply with less invasive treatments or if the less invasive treatments do not improve your condition. Surgery involves removing excess tissue in your airway to create a wider passage way.   This information is not intended to replace advice given to you by your health care provider. Make sure you discuss any questions you have with your health care provider.   Document Released: 08/18/2002 Document Revised: 09/18/2014 Document Reviewed: 01/04/2012 [employer] Interactive Patient Education 2016 [employer]   

## 2015-06-18 NOTE — Progress Notes (Signed)
Reason for visit: Facial swelling  Referring physician: Dr. BurnettAla DachAYA Yang is a 64 y.o. female  History of present illness:  Samantha Yang is a 64 year old right-handed white female with a history of migraine headaches in the past. The patient was seen through this office in 2008 with some reports of numbness in the feet. The patient had been noted to have a vitamin B12 deficiency, and replacement of the vitamin B12 seemed to stabilize her symptoms, but she has had persistent numbness in the feet since that time. The patient has had improvement over the years with her migraine. Approximately 3 months ago, the patient began noting some puffiness around the eyes bilaterally, and onset of daily headaches that persisted for about 6 weeks, then went away. The patient did not have any nausea or vomiting, or any visual complaints with the headache. She reported no numbness or weakness of the face, arms, or legs. The patient reported no dizziness, but she does have some chronic neck discomfort. The patient has had improvement in the facial swelling and periorbital swelling within the last 2 or 3 weeks. She has undergone MRI evaluation of the brain on 05/12/2015 which is unremarkable. The patient denies any balance issues or difficulty controlling the bowels or the bladder. She does report ongoing issues with fibromyalgia, chronic fatigue, and sleep apnea. Her husband will wake her up frequently as he has observed sleep apnea, and is frightened for her. The patient has been evaluated for OSA approximately 6 years ago, and she had mild apnea at that time, CPAP was not recommended. She is sent to this office for an evaluation.  Past Medical History  Diagnosis Date  . Depression   . Thyroid disease   . H/O seasonal allergies   . 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 Hearing loss   . Anemia   . Fatigue   . Fibromyalgia   . Anxiety   . Hypothyroidism   . Vitamin B12 deficiency   .  Vitamin D deficiency     Past Surgical History  Procedure Laterality Date  . Back surgery      Kyphoplasty, compression fx    Family History  Problem Relation Age of Onset  . Hypertension Mother   . Diabetes Mother   . Cancer Father   . Diabetes Brother     Social history:  reports that she has never smoked. She has never used smokeless tobacco. She reports that she does not drink alcohol or use illicit drugs.  Medications:  Prior to Admission medications   Medication Sig Start Date End Date Taking? Authorizing Provider  buPROPion (WELLBUTRIN XL) 150 MG 24 hr tablet Take 150 mg by mouth daily.   Yes Historical Provider, MD  ketorolac (TORADOL) 10 MG tablet Take 1 tablet (10 mg total) by mouth every 6 (six) hours as needed for pain. 08/12/12  Yes Hannah Muthersbaugh, PA-C  levothyroxine (SYNTHROID, LEVOTHROID) 112 MCG tablet Take 112 mcg by mouth daily before breakfast.   Yes Historical Provider, MD  Loratadine-Pseudoephedrine (CLARITIN-D 24 HOUR PO) Take 1 tablet by mouth daily.   Yes Historical Provider, MD  methocarbamol (ROBAXIN) 500 MG tablet Take 1 tablet (500 mg total) by mouth 2 (two) times daily. Patient taking differently: Take 500 mg by mouth 2 (two) times daily as needed.  08/12/12  Yes Hannah Muthersbaugh, PA-C  mometasone (NASONEX) 50 MCG/ACT nasal spray Place 2 sprays into the nose daily.   Yes  Historical Provider, MD  sertraline (ZOLOFT) 100 MG tablet Take 100 mg by mouth daily.   Yes Historical Provider, MD  Vitamin D, Ergocalciferol, (DRISDOL) 50000 UNITS CAPS capsule Take 50,000 Units by mouth every 7 (seven) days.   Yes Historical Provider, MD      Allergies  Allergen Reactions  . Amoxicillin   . Codeine   . Hydrocodone Nausea And Vomiting  . Penicillins Hives and Nausea And Vomiting    ROS:  Out of a complete 14 system review of symptoms, the patient complains only of the following symptoms, and all other reviewed systems are negative.  Fatigue Hearing  loss, ringing in the ears Snoring Easy bruising Feeling hot Allergies, runny nose Numbness Depression, decreased energy  Blood pressure 117/73, pulse 76, height 5' 2.75" (1.594 m), weight 180 lb (81.647 kg).  Physical Exam  General: The patient is alert and cooperative at the time of the examination. The patient is moderately obese.  Eyes: Pupils are equal, round, and reactive to light. Discs are flat bilaterally.  Neck: The neck is supple, no carotid bruits are noted.  Respiratory: The respiratory examination is clear.  Cardiovascular: The cardiovascular examination reveals a regular rate and rhythm, no obvious murmurs or rubs are noted.  Skin: Extremities are without significant edema.  Neurologic Exam  Mental status: The patient is alert and oriented x 3 at the time of the examination. The patient has apparent normal recent and remote memory, with an apparently normal attention span and concentration ability.  Cranial nerves: Facial symmetry is present. There is good sensation of the face to pinprick and soft touch bilaterally. The strength of the facial muscles and the muscles to head turning and shoulder shrug are normal bilaterally. Speech is well enunciated, no aphasia or dysarthria is noted. Extraocular movements are full. Visual fields are full. The tongue is midline, and the patient has symmetric elevation of the soft palate. No obvious hearing deficits are noted.  Motor: The motor testing reveals 5 over 5 strength of all 4 extremities. Good symmetric motor tone is noted throughout.  Sensory: Sensory testing is intact to pinprick, soft touch, vibration sensation, and position sense on all 4 extremities, with exception of some decreased pinprick sensation below the knees bilaterally, decreased vibration sensation in the right greater than left leg. No evidence of extinction is noted.  Coordination: Cerebellar testing reveals good finger-nose-finger and heel-to-shin  bilaterally.  Gait and station: Gait is normal. Tandem gait is normal. Romberg is negative. No drift is seen.  Reflexes: Deep tendon reflexes are symmetric and normal bilaterally. Toes are downgoing bilaterally.   MRI brain 05/12/15:  IMPRESSION: 1. Normal MRI appearance of the brain. 2. Minimal fluid in the mastoid air cells bilaterally. No obstructing nasopharyngeal lesion is present. 3. No acute or focal lesion to explain the facial or periorbital soft tissue swelling.  * MRI scan images were reviewed online. I agree with the written report.     Assessment/Plan:  1. Headache, facial swelling, good resolution  2. History of migraine  3. History of sleep apnea  The patient has had resolution of headaches and swelling around the eyes. This may be related to allergies or excessive salt intake. The patient indicates that she likes to use a lot of salt in the diet. The patient did not note any swelling elsewhere. She does have thyroid disease, this is followed through her primary care physician. The patient is concerned about her episodes of sleep apnea, this seems to have increased  recently, we will send the patient for another sleep evaluation. Otherwise, no further neurologic workup is indicated for her other symptoms.  Marlan Palau MD 06/18/2015 11:09 AM  [facility] [address] [place], [place] [address]  Phone [phone] Fax [phone]

## 2015-06-21 ENCOUNTER — Ambulatory Visit (INDEPENDENT_AMBULATORY_CARE_PROVIDER_SITE_OTHER): Payer: Medicare Other | Admitting: Neurology

## 2015-06-21 ENCOUNTER — Encounter: Payer: Self-pay | Admitting: Neurology

## 2015-06-21 VITALS — BP 124/78 | HR 76 | Resp 14 | Ht 62.75 in | Wt 179.0 lb

## 2015-06-21 DIAGNOSIS — G4733 Obstructive sleep apnea (adult) (pediatric): Secondary | ICD-10-CM

## 2015-06-21 DIAGNOSIS — G4719 Other hypersomnia: Secondary | ICD-10-CM

## 2015-06-21 DIAGNOSIS — R351 Nocturia: Secondary | ICD-10-CM

## 2015-06-21 DIAGNOSIS — E669 Obesity, unspecified: Secondary | ICD-10-CM

## 2015-06-21 NOTE — Progress Notes (Signed)
Subjective:    Patient ID: Samantha Yang is a 64 y.o. female.  HPI     Huston Foley, MD, PhD [facility] [address], [address] [address] [place], [place] [address]  Dear Mellody Dance,   I saw your patient, Samantha Yang upon your kind request in my clinic today for initial consultation of her sleep disorder, in particular, concern for underlying obstructive sleep apnea, for re-evaluation thereof. The patient is unaccompanied today. As you know, Samantha Yang is a 64 year old right-handed woman with an underlying medical history of depression, thyroid disease, allergies, B12 deficiency and neuropathy, hearing loss, anemia, fatigue, fibromyalgia, anxiety, vitamin D deficiency as well as obesity, who reports a prior diagnosis of OSA. I reviewed a diagnostic polysomnogram report from 06/07/2009, interpreted by Dr. Vickey Huger: Sleep efficiency was 87%. Sleep latency was 28.5 minutes. She had a prolonged REM latency of 181 minutes and a decreased percentage of REM sleep at 14.1%. She had a total AHI of 7.6 per hour, rising to 14.7 per hour during REM sleep. Lowest oxygen saturation was 84%. Her PLM arousal index was 4.5 per hour. CPAP therapy was initiated at the time. Her weight was 177 pounds at the time for a BMI of 30.4.  I reviewed your office note from 06/18/2015. This test results are not available for my review. She reports snoring and excessive daytime somnolence, non-restorative sleep and nocturia. She denies any significant restless leg symptoms. Symptoms are worse when she sleeps on her back. She tries to sleep on her sides but gets hip pain bilaterally. She has gained some weight over the course of time with some weight fluctuation. She has had changes in her thyroid medication from time to time. She has occasional low back pain for which she takes Robaxin as needed. She has had recurrent headaches and history of migraines in the distant past but recently has had no significant  headaches. She denies morning headaches. She has to get up and use the bathroom once or twice on an average night. Bedtime is typically around 10 PM and she does not have any trouble going to sleep. She has some sleep disruption. Rise time is around 7 AM. She does not typically wake up rested. She has no family history of obstructive sleep apnea. She drinks one can of soda per day and no tea and typically no coffee. 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 She has 2 grown children. She lives with her husband. She does not typically watch TV in bed. She does not typically read in bed either. She does not typically take a nap. She feels tired during the day. Her Epworth sleepiness score is 10 out of 24 today, her fatigue score is 39 out of 63.  Her Past Medical History Is Significant For: Past Medical History  Diagnosis Date  . Depression   . Thyroid disease   . H/O seasonal allergies   . 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 Hearing loss   . Anemia   . Fatigue   . Fibromyalgia   . Anxiety   . Hypothyroidism   . Vitamin B12 deficiency   . Vitamin D deficiency     Her Past Surgical History Is Significant For: Past Surgical History  Procedure Laterality Date  . Back surgery      Kyphoplasty, compression fx    Her Family History Is Significant For: Family History  Problem Relation  Age of Onset  . Hypertension Mother   . Diabetes Mother   . Cancer Father   . Diabetes Brother     Her Social History Is Significant For: Social History   Social History  . Marital Status: Married    Spouse Name: Brett Canales  . Number of Children: 2  . Years of Education: College   Occupational History  .  Other    n/a   Social History Main Topics  . Smoking status: Never Smoker   . Smokeless tobacco: Never Used  . Alcohol Use: No  . Drug Use: No  . Sexual Activity: Not Asked   Other Topics Concern  . None   Social History Narrative   Patient  lives at home with spouse.   Caffeine use: 1 Coca-Cola daily    Her Allergies Are:  Allergies  Allergen Reactions  . Amoxicillin   . Codeine   . Hydrocodone Nausea And Vomiting  . Penicillins Hives and Nausea And Vomiting  :   Her Current Medications Are:  Outpatient Encounter Prescriptions as of 06/21/2015  Medication Sig  . buPROPion (WELLBUTRIN XL) 150 MG 24 hr tablet Take 150 mg by mouth daily.  Marland Kitchen ketorolac (TORADOL) 10 MG tablet Take 1 tablet (10 mg total) by mouth every 6 (six) hours as needed for pain.  Marland Kitchen levothyroxine (SYNTHROID, LEVOTHROID) 112 MCG tablet Take 112 mcg by mouth daily before breakfast.  . Loratadine-Pseudoephedrine (CLARITIN-D 24 HOUR PO) Take 1 tablet by mouth daily.  . methocarbamol (ROBAXIN) 500 MG tablet Take 1 tablet (500 mg total) by mouth 2 (two) times daily. (Patient taking differently: Take 500 mg by mouth 2 (two) times daily as needed. )  . mometasone (NASONEX) 50 MCG/ACT nasal spray Place 2 sprays into the nose daily.  . sertraline (ZOLOFT) 100 MG tablet Take 100 mg by mouth daily.  . Vitamin D, Ergocalciferol, (DRISDOL) 50000 UNITS CAPS capsule Take 50,000 Units by mouth every 7 (seven) days.   No facility-administered encounter medications on file as of 06/21/2015.  :  Review of Systems:  Out of a complete 14 point review of systems, all are reviewed and negative with the exception of these symptoms as listed below:   Review of Systems  Constitutional: Positive for fatigue.  HENT: Positive for hearing loss, rhinorrhea and tinnitus.   Endocrine:       Feeling hot   Allergic/Immunologic: Positive for environmental allergies.  Neurological:       No trouble falling asleep, has trouble staying asleep, snoring, started sleep on sides but causes hip pain, witnessed apnea, wakes up in the morning feeling tired, takes naps during the day.   Hematological: Bruises/bleeds easily.  Psychiatric/Behavioral:       Decreased energy     Objective:   Neurologic Exam  Physical Exam Physical Examination:   Filed Vitals:   06/21/15 0917  BP: 124/78  Pulse: 76  Resp: 14   General Examination: The patient is a very pleasant 64 y.o. female in no acute distress. She appears well-developed and well-nourished and well groomed.   HEENT: Normocephalic, atraumatic, pupils are equal, round and reactive to light and accommodation. Funduscopic exam is normal with sharp disc margins noted. Extraocular tracking is good without limitation to gaze excursion or nystagmus noted. Normal smooth pursuit is noted. Hearing is grossly intact. Tympanic membranes are clear bilaterally. Face is symmetric with normal facial animation and normal facial sensation. Speech is clear with no dysarthria noted. There is no hypophonia. There is no  lip, neck/head, jaw or voice tremor. Neck is supple with full range of passive and active motion. There are no carotid bruits on auscultation. Oropharynx exam reveals: mild mouth dryness, good dental hygiene and moderate airway crowding, due to larger uvula, redundant soft palate and a smaller airway entry. Mallampati is class III. Tongue protrudes centrally and palate elevates symmetrically. Tonsils are small but not fully visualized. Neck size is 15 1/8 inches. She has a Mild overbite. Nasal inspection reveals no significant nasal mucosal bogginess or redness and no septal deviation.   Chest: Clear to auscultation without wheezing, rhonchi or crackles noted.  Heart: S1+S2+0, regular and normal without murmurs, rubs or gallops noted.   Abdomen: Soft, non-tender and non-distended with normal bowel sounds appreciated on auscultation.  Extremities: There is no pitting edema in the distal lower extremities bilaterally. Pedal pulses are intact.  Skin: Warm and dry without trophic changes noted. There are no varicose veins in the distal legs, but spider veins.  Musculoskeletal: exam reveals no obvious joint deformities, tenderness or  joint swelling or erythema.   Neurologically:  Mental status: The patient is awake, alert and oriented in all 4 spheres. Her immediate and remote memory, attention, language skills and fund of knowledge are appropriate. There is no evidence of aphasia, agnosia, apraxia or anomia. Speech is clear with normal prosody and enunciation. Thought process is linear. Mood is normal and affect is normal.  Cranial nerves II - XII are as described above under HEENT exam. In addition: shoulder shrug is normal with equal shoulder height noted. Motor exam: Normal bulk, strength and tone is noted. There is no drift, tremor or rebound. Romberg is negative. Reflexes are 2+ throughout. Babinski: Toes are flexor bilaterally. Fine motor skills and coordination: intact with normal finger taps, normal hand movements, normal rapid alternating patting, normal foot taps and normal foot agility.  Cerebellar testing: No dysmetria or intention tremor on finger to nose testing. Heel to shin is unremarkable bilaterally. There is no truncal or gait ataxia.  Sensory exam: intact to light touch, vibration, temperature sense in the upper and lower extremities.  Gait, station and balance: She stands easily. No veering to one side is noted. No leaning to one side is noted. Posture is age-appropriate and stance is narrow based. Gait shows normal stride length and normal pace. No problems turning are noted. She turns en bloc. Tandem walk is unremarkable. Intact toe and heel stance is noted.               Assessment and Plan:  In summary, Samantha Yang is a very pleasant 64 y.o.-year old female with an underlying medical history of depression, thyroid disease, allergies, B12 deficiency and neuropathy, hearing loss, anemia, fatigue, fibromyalgia, anxiety, vitamin D deficiency as well as obesity, who reports a prior diagnosis of OSA. She reports snoring, excessive daytime somnolence and nocturia as well as nonrestorative sleep. Her history and  physical exam are in keeping with obstructive sleep apnea (OSA).  I had a long chat with the patient about my findings and the diagnosis of OSA, its prognosis and treatment options. We talked about medical treatments, surgical interventions and non-pharmacological approaches. I explained in particular the risks and ramifications of untreated moderate to severe OSA, especially with respect to developing cardiovascular disease down the Road, including congestive heart failure, difficult to treat hypertension, cardiac arrhythmias, or stroke. Even type 2 diabetes has, in part, been linked to untreated OSA. Symptoms of untreated OSA include daytime sleepiness, memory problems,  mood irritability and mood disorder such as depression and anxiety, lack of energy, as well as recurrent headaches, especially morning headaches. We talked about trying to maintain a healthy lifestyle in general, as well as the importance of weight control. I encouraged the patient to eat healthy, exercise daily and keep well hydrated, to keep a scheduled bedtime and wake time routine, to not skip any meals and eat healthy snacks in between meals. I advised the patient not to drive when feeling sleepy. I recommended the following at this time: sleep study with potential positive airway pressure titration. (We will score hypopneas at 4% and split the sleep study into diagnostic and treatment portion, if the estimated. 2 hour AHI is >15/h).   I explained the sleep test procedure to the patient and also outlined possible surgical and non-surgical treatment options of OSA, including the use of a custom-made dental device (which would require a referral to a specialist dentist or oral surgeon), upper airway surgical options, such as pillar implants, radiofrequency surgery, tongue base surgery, and UPPP (which would involve a referral to an ENT surgeon). Rarely, jaw surgery such as mandibular advancement may be considered.  I also explained the CPAP  treatment option to the patient, who indicated that she would be willing to try CPAP if the need arises. I explained the importance of being compliant with PAP treatment, not only for insurance purposes but primarily to improve Her symptoms, and for the patient's long term health benefit, including to reduce Her cardiovascular risks. I answered all her questions today and the patient was in agreement. I would like to see her back after the sleep study is completed and encouraged her to call with any interim questions, concerns, problems or updates.   Thank you very much for allowing me to participate in the care of this nice patient. If I can be of any further assistance to you please do not hesitate to talk to me.  Sincerely,   Huston Foley, MD, PhD

## 2015-06-21 NOTE — Patient Instructions (Signed)

## 2015-07-05 ENCOUNTER — Ambulatory Visit (INDEPENDENT_AMBULATORY_CARE_PROVIDER_SITE_OTHER): Payer: Medicare Other | Admitting: Neurology

## 2015-07-05 DIAGNOSIS — G4761 Periodic limb movement disorder: Secondary | ICD-10-CM

## 2015-07-05 DIAGNOSIS — G479 Sleep disorder, unspecified: Secondary | ICD-10-CM

## 2015-07-05 DIAGNOSIS — G4720 Circadian rhythm sleep disorder, unspecified type: Secondary | ICD-10-CM

## 2015-07-05 DIAGNOSIS — R0683 Snoring: Secondary | ICD-10-CM

## 2015-07-05 DIAGNOSIS — G4733 Obstructive sleep apnea (adult) (pediatric): Secondary | ICD-10-CM

## 2015-07-06 NOTE — Sleep Study (Signed)
Please see the scanned sleep study interpretation located in the Procedure tab within the Chart Review section. 

## 2015-07-07 DIAGNOSIS — E039 Hypothyroidism, unspecified: Secondary | ICD-10-CM | POA: Insufficient documentation

## 2015-07-07 DIAGNOSIS — M549 Dorsalgia, unspecified: Secondary | ICD-10-CM | POA: Insufficient documentation

## 2015-07-07 DIAGNOSIS — R5383 Other fatigue: Secondary | ICD-10-CM | POA: Insufficient documentation

## 2015-07-07 DIAGNOSIS — J309 Allergic rhinitis, unspecified: Secondary | ICD-10-CM | POA: Insufficient documentation

## 2015-07-07 DIAGNOSIS — F411 Generalized anxiety disorder: Secondary | ICD-10-CM | POA: Insufficient documentation

## 2015-07-07 DIAGNOSIS — M791 Myalgia, unspecified site: Secondary | ICD-10-CM | POA: Insufficient documentation

## 2015-07-09 ENCOUNTER — Telehealth: Payer: Self-pay | Admitting: Neurology

## 2015-07-09 NOTE — Telephone Encounter (Signed)
Patient referred by Dr. Anne HahnWillis, seen by me on 06/21/15, diagnostic PSG on 07/05/15.   Please call and notify the patient that the recent sleep study did not show any significant obstructive sleep apnea. She had PLMs, meaning leg twitching in her sleep, likely due to medication effect from her antidepressant. Mild snoring was noted. For this, weight loss may help, CPAP is not necessary. She achieved no deep sleep and less dream sleep.  I would like to re-iterate good sleep hygiene: Keep a regular sleep and wake schedule, try not to exercise or have a meal within 2 hours of your bedtime, try to keep your bedroom conducive for sleep, that is, cool and dark, without light distractors such as an illuminated alarm clock, and refrain from watching TV right before sleep or in the middle of the night and do not keep the TV or radio on during the night. Also, try not to use or play on electronic devices at bedtime, such as your cell phone, tablet PC or laptop. If you like to read at bedtime on an electronic device, try to dim the background light as much as possible. Do not eat in the middle of the night.   Please inform patient that she can FU with Dr. Anne HahnWillis as planned. Also, route or fax report to PCP and referring MD, if other than PCP.  Once you have spoken to patient, you can close this encounter.   Thanks,  Huston FoleySaima Danna Sewell, MD, PhD [facility]

## 2015-07-14 NOTE — Telephone Encounter (Signed)
Patient called back. I gave her results and recommendation. She voices understanding and will f/u with Dr. Anne HahnWillis. I will fax report to PCP. I will also mail patient a form that has good sleep hygiene pointers.

## 2015-07-14 NOTE — Telephone Encounter (Signed)
Left message for patient to call back for results.  

## 2016-11-09 ENCOUNTER — Other Ambulatory Visit: Payer: Self-pay | Admitting: Family Medicine

## 2016-11-09 DIAGNOSIS — R5381 Other malaise: Secondary | ICD-10-CM

## 2016-11-10 ENCOUNTER — Other Ambulatory Visit: Payer: Self-pay | Admitting: Family Medicine

## 2016-11-10 DIAGNOSIS — M810 Age-related osteoporosis without current pathological fracture: Secondary | ICD-10-CM

## 2016-11-10 DIAGNOSIS — Z1231 Encounter for screening mammogram for malignant neoplasm of breast: Secondary | ICD-10-CM

## 2016-12-06 ENCOUNTER — Ambulatory Visit
Admission: RE | Admit: 2016-12-06 | Discharge: 2016-12-06 | Disposition: A | Discharge status: Home / Self Care | Payer: Medicare Other | Source: Ambulatory Visit | Attending: Family Medicine | Admitting: Family Medicine

## 2016-12-06 DIAGNOSIS — M810 Age-related osteoporosis without current pathological fracture: Secondary | ICD-10-CM

## 2016-12-06 DIAGNOSIS — Z1231 Encounter for screening mammogram for malignant neoplasm of breast: Secondary | ICD-10-CM

## 2017-09-20 ENCOUNTER — Encounter (INDEPENDENT_AMBULATORY_CARE_PROVIDER_SITE_OTHER): Payer: Self-pay | Admitting: Orthopedic Surgery

## 2017-09-20 ENCOUNTER — Ambulatory Visit (INDEPENDENT_AMBULATORY_CARE_PROVIDER_SITE_OTHER): Payer: Medicare Other | Admitting: Orthopedic Surgery

## 2017-09-20 DIAGNOSIS — M7661 Achilles tendinitis, right leg: Secondary | ICD-10-CM | POA: Diagnosis not present
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
## 2017-10-01 ENCOUNTER — Telehealth (INDEPENDENT_AMBULATORY_CARE_PROVIDER_SITE_OTHER): Payer: Self-pay | Admitting: Orthopedic Surgery

## 2017-10-01 NOTE — Telephone Encounter (Signed)
Pt called and would like to know if its any other methods of treatment other than her boot. Pt stated the boot is causing pain and knocking her body out of alignment.

## 2017-10-01 NOTE — Telephone Encounter (Signed)
Advised to dc boot per [place] and just continue with heel lifts in regular shoe. Advised this make take longer for things to heel, but to just take it a day at a time. Return at scheduled follow up for recheck.

## 2017-10-18 ENCOUNTER — Ambulatory Visit (INDEPENDENT_AMBULATORY_CARE_PROVIDER_SITE_OTHER): Payer: Medicare Other | Admitting: Orthopedic Surgery

## 2019-05-12 ENCOUNTER — Other Ambulatory Visit: Payer: Self-pay | Admitting: Family Medicine

## 2019-05-12 DIAGNOSIS — Z1231 Encounter for screening mammogram for malignant neoplasm of breast: Secondary | ICD-10-CM

## 2019-05-14 ENCOUNTER — Ambulatory Visit
Admission: RE | Admit: 2019-05-14 | Discharge: 2019-05-14 | Disposition: A | Discharge status: Home / Self Care | Payer: Medicare Other | Source: Ambulatory Visit | Attending: Family Medicine | Admitting: Family Medicine

## 2019-05-14 ENCOUNTER — Other Ambulatory Visit: Payer: Self-pay

## 2019-05-14 DIAGNOSIS — Z1231 Encounter for screening mammogram for malignant neoplasm of breast: Secondary | ICD-10-CM

## 2019-07-28 ENCOUNTER — Other Ambulatory Visit: Payer: Self-pay

## 2019-07-28 DIAGNOSIS — Z20822 Contact with and (suspected) exposure to covid-19: Secondary | ICD-10-CM

## 2019-07-30 LAB — NOVEL CORONAVIRUS, NAA: SARS-CoV-2, NAA: NOT DETECTED

## 2019-08-26 DIAGNOSIS — E782 Mixed hyperlipidemia: Secondary | ICD-10-CM | POA: Insufficient documentation

## 2020-07-19 ENCOUNTER — Institutional Professional Consult (permissible substitution): Payer: Medicare Other | Admitting: Plastic Surgery

## 2020-07-29 ENCOUNTER — Institutional Professional Consult (permissible substitution): Payer: Medicare Other | Admitting: Plastic Surgery

## 2020-08-25 ENCOUNTER — Institutional Professional Consult (permissible substitution): Payer: Medicare Other | Admitting: Plastic Surgery

## 2020-09-09 ENCOUNTER — Other Ambulatory Visit: Payer: Self-pay

## 2020-09-09 ENCOUNTER — Encounter: Payer: Self-pay | Admitting: Plastic Surgery

## 2020-09-09 ENCOUNTER — Ambulatory Visit: Payer: Medicare Other | Admitting: Plastic Surgery

## 2020-09-09 VITALS — BP 132/77 | HR 70 | Temp 98.2°F | Ht 60.0 in | Wt 142.0 lb

## 2020-09-09 DIAGNOSIS — L989 Disorder of the skin and subcutaneous tissue, unspecified: Secondary | ICD-10-CM

## 2020-09-09 NOTE — Progress Notes (Signed)
Referring Provider Barbie Banner, MD [address] [place],  [place] [address]   CC:  Chief Complaint  Patient presents with  . Advice Only      Samantha Yang is an 69 y.o. female.  HPI: Patient presents to discuss dynamic and static lines on her face. She is bothered by the aging process and is particularly bothered by the general area. She is also interested in making her eyes look a little bit less tired. She is opposed to a full surgical procedure. She seems interested in nonsurgical treatments and smaller surgical procedures. She was sent by her dermatologist who has done Mohs procedures and also treated some actinic changes with topical treatments.  Allergies  Allergen Reactions  . Amoxicillin   . Codeine   . Hydrocodone Nausea And Vomiting  . Penicillins Hives and Nausea And Vomiting    Outpatient Encounter Medications as of 09/09/2020  Medication Sig  . buPROPion (WELLBUTRIN XL) 150 MG 24 hr tablet Take 150 mg by mouth daily.  Marland Kitchen ketorolac (TORADOL) 10 MG tablet Take 1 tablet (10 mg total) by mouth every 6 (six) hours as needed for pain.  Marland Kitchen levothyroxine (SYNTHROID, LEVOTHROID) 112 MCG tablet Take 112 mcg by mouth daily before breakfast.  . Loratadine-Pseudoephedrine (CLARITIN-D 24 HOUR PO) Take 1 tablet by mouth daily.  . methocarbamol (ROBAXIN) 500 MG tablet Take 1 tablet (500 mg total) by mouth 2 (two) times daily. (Patient taking differently: Take 500 mg by mouth 2 (two) times daily as needed. )  . mometasone (NASONEX) 50 MCG/ACT nasal spray Place 2 sprays into the nose daily.  . sertraline (ZOLOFT) 100 MG tablet Take 100 mg by mouth daily.  . Vitamin D, Ergocalciferol, (DRISDOL) 50000 UNITS CAPS capsule Take 50,000 Units by mouth every 7 (seven) days.   No facility-administered encounter medications on file as of 09/09/2020.     Past Medical History:  Diagnosis Date  . Anemia   . Anxiety   . Depression   . Fatigue   . Fibromyalgia   . H/O seasonal  allergies   . Hearing loss   . Hypothyroidism   . 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 Thyroid disease   . Vitamin B12 deficiency   . Vitamin D deficiency     Past Surgical History:  Procedure Laterality Date  . BACK SURGERY     Kyphoplasty, compression fx    Family History  Problem Relation Age of Onset  . Hypertension Mother   . Diabetes Mother   . Cancer Father   . Diabetes Brother   . Breast cancer Neg Hx     Social History   Social History Narrative   Patient lives at home with spouse.   Caffeine use: 1 Coca-Cola daily  Denies tobacco use  Review of Systems General: Denies fevers, chills, weight loss CV: Denies chest pain, shortness of breath, palpitations  Physical Exam Vitals with BMI 09/09/2020 06/21/2015 06/18/2015  Height 5\' 0"  5' 2.75" 5' 2.75"  Weight 142 lbs 179 lbs 180 lbs  BMI 27.73 32 32.2  Systolic 132 124  Diastolic 77 78 73  Pulse 70 76 76    General:  No acute distress,  Alert and oriented, Non-Toxic, Normal speech and affect HEENT: Normocephalic atraumatic. Extraocular movements intact. Cranial nerves grossly intact. No obvious scars. She has moderate moderate to severe laxity in the gel area. She has dynamic and static lines in this area as well. Her forehead is  fairly smooth with brows in good position. She does not have a significant amount of dermatochalasis. She has some laxity and platysmal banding in the neck.  Assessment/Plan We discussed a number of cosmetic procedures for the patient today to address her concerns. Her first priority is the jowl area and in the absence of a full facelift I think a mini facelift would be a reasonable procedure for her in that area. I explained the procedure to her in detail including the risks include bleeding, infection, damage to surrounding structures and need for additional procedures. I explained I explained the location and orientation of the scars and the fact that it would benefit the jowl  area but not to the extent that a full facelift would. She is interested in this procedure and particularly the fact that it can be done in the office with less downtime than a full facelift. For the forehead short of a brow lift she may get some subtle improvement with Botox and she has been to think about this. For the nasolabial folds and infraorbital areas I think some conservative filler would offer her an improvement but this is further down the list of her priorities. We will plan to provide a quote for her for a mini facelift in the office and proceed from there.  [name] 09/09/2020, 10:55 AM

## 2020-09-15 ENCOUNTER — Other Ambulatory Visit: Payer: Self-pay | Admitting: Family Medicine

## 2020-09-15 DIAGNOSIS — M8580 Other specified disorders of bone density and structure, unspecified site: Secondary | ICD-10-CM

## 2020-09-15 DIAGNOSIS — Z1231 Encounter for screening mammogram for malignant neoplasm of breast: Secondary | ICD-10-CM

## 2020-09-20 DIAGNOSIS — H90A31 Mixed conductive and sensorineural hearing loss, unilateral, right ear with restricted hearing on the contralateral side: Secondary | ICD-10-CM | POA: Insufficient documentation

## 2020-10-20 DIAGNOSIS — Z1231 Encounter for screening mammogram for malignant neoplasm of breast: Secondary | ICD-10-CM

## 2020-12-30 ENCOUNTER — Other Ambulatory Visit: Payer: Medicare Other

## 2020-12-30 ENCOUNTER — Ambulatory Visit: Payer: Medicare Other

## 2021-06-07 ENCOUNTER — Ambulatory Visit
Admission: RE | Admit: 2021-06-07 | Discharge: 2021-06-07 | Disposition: A | Discharge status: Home / Self Care | Payer: Medicare Other | Source: Ambulatory Visit | Attending: Family Medicine | Admitting: Family Medicine

## 2021-06-07 ENCOUNTER — Other Ambulatory Visit: Payer: Self-pay

## 2021-06-07 DIAGNOSIS — Z1231 Encounter for screening mammogram for malignant neoplasm of breast: Secondary | ICD-10-CM

## 2021-06-07 DIAGNOSIS — M8580 Other specified disorders of bone density and structure, unspecified site: Secondary | ICD-10-CM

## 2022-10-23 ENCOUNTER — Emergency Department (HOSPITAL_BASED_OUTPATIENT_CLINIC_OR_DEPARTMENT_OTHER): Payer: Medicare Other

## 2022-10-23 ENCOUNTER — Other Ambulatory Visit (HOSPITAL_BASED_OUTPATIENT_CLINIC_OR_DEPARTMENT_OTHER): Payer: Self-pay

## 2022-10-23 ENCOUNTER — Other Ambulatory Visit: Payer: Self-pay

## 2022-10-23 ENCOUNTER — Encounter (HOSPITAL_BASED_OUTPATIENT_CLINIC_OR_DEPARTMENT_OTHER): Payer: Self-pay | Admitting: Emergency Medicine

## 2022-10-23 ENCOUNTER — Emergency Department (HOSPITAL_BASED_OUTPATIENT_CLINIC_OR_DEPARTMENT_OTHER)
Admission: EM | Admit: 2022-10-23 | Discharge: 2022-10-23 | Disposition: A | Discharge status: Home / Self Care | Payer: Medicare Other | Attending: Emergency Medicine | Admitting: Emergency Medicine

## 2022-10-23 DIAGNOSIS — Z79899 Other long term (current) drug therapy: Secondary | ICD-10-CM | POA: Diagnosis not present

## 2022-10-23 DIAGNOSIS — R519 Headache, unspecified: Secondary | ICD-10-CM | POA: Insufficient documentation

## 2022-10-23 DIAGNOSIS — R197 Diarrhea, unspecified: Secondary | ICD-10-CM | POA: Diagnosis not present

## 2022-10-23 DIAGNOSIS — M542 Cervicalgia: Secondary | ICD-10-CM | POA: Diagnosis present

## 2022-10-23 DIAGNOSIS — M436 Torticollis: Secondary | ICD-10-CM | POA: Diagnosis not present

## 2022-10-23 DIAGNOSIS — Z1152 Encounter for screening for COVID-19: Secondary | ICD-10-CM | POA: Insufficient documentation

## 2022-10-23 DIAGNOSIS — R110 Nausea: Secondary | ICD-10-CM | POA: Diagnosis not present

## 2022-10-23 LAB — CBC WITH DIFFERENTIAL/PLATELET
Abs Immature Granulocytes: 0.01 10*3/uL (ref 0.00–0.07)
Basophils Absolute: 0 10*3/uL (ref 0.0–0.1)
Basophils Relative: 1 %
Eosinophils Absolute: 0.2 10*3/uL (ref 0.0–0.5)
Eosinophils Relative: 5 %
HCT: 41.7 % (ref 36.0–46.0)
Hemoglobin: 13.9 g/dL (ref 12.0–15.0)
Immature Granulocytes: 0 %
Lymphocytes Relative: 31 %
Lymphs Abs: 1.1 10*3/uL (ref 0.7–4.0)
MCH: 30.7 pg (ref 26.0–34.0)
MCHC: 33.3 g/dL (ref 30.0–36.0)
MCV: 92.1 fL (ref 80.0–100.0)
Monocytes Absolute: 0.5 10*3/uL (ref 0.1–1.0)
Monocytes Relative: 14 %
Neutro Abs: 1.7 10*3/uL (ref 1.7–7.7)
Neutrophils Relative %: 49 %
Platelets: 226 10*3/uL (ref 150–400)
RBC: 4.53 MIL/uL (ref 3.87–5.11)
RDW: 12.7 % (ref 11.5–15.5)
WBC: 3.6 10*3/uL — ABNORMAL LOW (ref 4.0–10.5)
nRBC: 0 % (ref 0.0–0.2)

## 2022-10-23 LAB — RESP PANEL BY RT-PCR (RSV, FLU A&B, COVID)  RVPGX2
Influenza A by PCR: NEGATIVE
Influenza B by PCR: NEGATIVE
Resp Syncytial Virus by PCR: NEGATIVE
SARS Coronavirus 2 by RT PCR: NEGATIVE

## 2022-10-23 LAB — COMPREHENSIVE METABOLIC PANEL
ALT: 17 U/L (ref 0–44)
AST: 31 U/L (ref 15–41)
Albumin: 4.3 g/dL (ref 3.5–5.0)
Alkaline Phosphatase: 71 U/L (ref 38–126)
Anion gap: 9 (ref 5–15)
BUN: 17 mg/dL (ref 8–23)
CO2: 27 mmol/L (ref 22–32)
Calcium: 9.2 mg/dL (ref 8.9–10.3)
Chloride: 101 mmol/L (ref 98–111)
Creatinine, Ser: 0.89 mg/dL (ref 0.44–1.00)
GFR, Estimated: >60 mL/min (ref >60)
Glucose, Bld: 95 mg/dL (ref 70–99)
Potassium: 4.7 mmol/L (ref 3.5–5.1)
Sodium: 137 mmol/L (ref 135–145)
Total Bilirubin: 0.4 mg/dL (ref 0.3–1.2)
Total Protein: 6.8 g/dL (ref 6.5–8.1)

## 2022-10-23 LAB — LIPASE, BLOOD: Lipase: 11 U/L (ref 11–51)

## 2022-10-23 MED ORDER — METOCLOPRAMIDE HCL 5 MG/ML IJ SOLN
5.0000 mg | Freq: Once | INTRAMUSCULAR | Status: AC
  Administered 2022-10-23: 5 mg via INTRAVENOUS
  Filled 2022-10-23: qty 2

## 2022-10-23 MED ORDER — PROCHLORPERAZINE MALEATE 5 MG PO TABS
5.0000 mg | ORAL_TABLET | Freq: Four times a day (QID) | ORAL | 0 refills | Status: DC | PRN
  Filled 2022-10-23: qty 20, 5d supply, fill #0

## 2022-10-23 MED ORDER — SODIUM CHLORIDE 0.9 % IV BOLUS
1000.0000 mL | Freq: Once | INTRAVENOUS | Status: AC
  Administered 2022-10-23: 1000 mL via INTRAVENOUS

## 2022-10-23 NOTE — ED Provider Notes (Deleted)
[facility] Provider Note   CSN: [id number] Arrival date & time: 10/23/22  0901     History  Chief Complaint  Patient presents with   Neck Pain   Diarrhea   Migraine    Samantha Yang is a 72 y.o. female who presents for evaluation of headache. Patient had onset of severe headache beginning 5 days ago.  She reports fairly sudden onset of occipital headache which she describes as sharp, severe, bilateral, left greater than right, nonradiating.  She has had some associated neck stiffness.  She took Tylenol without relief.  She states her headache went away and she was fine on Friday.  Saturday she woke up with the same headache but also had severe nausea and diarrhea along with retching.  She states that this lasted about 12 hours.  She was too weak to take care of herself and her kids came over to help.  She states that her diarrhea has slowed down and her headache improved.  She was feeling better on Sunday however had return of persistent, dull, 3 out of 10 posterior headache and has some mild nausea at this time.  She normally takes homeopathic medications for nausea.  She denies photophobia, phonophobia, vision change, unilateral weight weakness, difficulty with speech, changes in vision, ataxia or vertigo.  She had no fever.  She denies any neck stiffness at this time.   Neck Pain Diarrhea Migraine       Home Medications Prior to Admission medications   Medication Sig Start Date End Date Taking? Authorizing Provider  buPROPion (WELLBUTRIN XL) 150 MG 24 hr tablet Take 150 mg by mouth daily.    [provider]  ketorolac (TORADOL) 10 MG tablet Take 1 tablet (10 mg total) by mouth every 6 (six) hours as needed for pain. 08/12/12   Muthersbaugh, Jarrett Soho, PA-C  levothyroxine (SYNTHROID, LEVOTHROID) 112 MCG tablet Take 112 mcg by mouth daily before breakfast.    [provider]  Loratadine-Pseudoephedrine (CLARITIN-D 24 HOUR  PO) Take 1 tablet by mouth daily.    [provider]  methocarbamol (ROBAXIN) 500 MG tablet Take 1 tablet (500 mg total) by mouth 2 (two) times daily. Patient taking differently: Take 500 mg by mouth 2 (two) times daily as needed.  08/12/12   Muthersbaugh, Jarrett Soho, PA-C  mometasone (NASONEX) 50 MCG/ACT nasal spray Place 2 sprays into the nose daily.    [provider]  sertraline (ZOLOFT) 100 MG tablet Take 100 mg by mouth daily.    [provider]  Vitamin D, Ergocalciferol, (DRISDOL) 50000 UNITS CAPS capsule Take 50,000 Units by mouth every 7 (seven) days.    [provider]      Allergies    Prednisone, Acetaminophen, Amoxicillin, Amoxicillin-pot clavulanate, Codeine, Hydrocodone, Oxycodone, Oxycodone hcl, and Penicillins    Review of Systems   Review of Systems  Gastrointestinal:  Positive for diarrhea.  Musculoskeletal:  Positive for neck pain.    Physical Exam Updated Vital Signs BP (!) 145/98 (BP Location: Left Arm)   Pulse 91   Temp 97.9 F (36.6 C) (Oral)   Resp 16   Ht 5' (1.524 m)   Wt 65.8 kg   SpO2 99%   BMI 28.32 kg/m  Physical Exam Physical Exam  Constitutional: Pt is oriented to person, place, and time. Pt appears well-developed and well-nourished. No distress.  HENT:  Head: Normocephalic and atraumatic.  Mouth/Throat: Oropharynx is clear and moist.  Eyes: Conjunctivae and EOM are  normal. Pupils are equal, round, and reactive to light. No scleral icterus.  No horizontal, vertical or rotational nystagmus  Neck: Normal range of motion.  Full active and passive ROM without pain No midline or paraspinal tenderness No nuchal rigidity or meningeal signs  Cardiovascular: Normal rate, regular rhythm and intact distal pulses.   Pulmonary/Chest: Effort normal and breath sounds normal. No respiratory distress. Pt has no wheezes. No rales.  Abdominal: Soft. Bowel sounds are normal. There is no tenderness. There is no rebound and no  guarding.  Musculoskeletal: Normal range of motion.  Lymphadenopathy:    No cervical adenopathy.  Neurological: Pt. is alert and oriented to person, place, and time. He has normal reflexes. No cranial nerve deficit.  Exhibits normal muscle tone. Coordination normal.  Mental Status:  Alert, oriented, thought content appropriate. Speech fluent without evidence of aphasia. Able to follow 2 step commands without difficulty.  Cranial Nerves:  II:  Peripheral visual fields grossly normal, pupils equal, round, reactive to light III,IV, VI: ptosis not present, extra-ocular motions intact bilaterally  V,VII: smile symmetric, facial light touch sensation equal VIII: hearing grossly normal bilaterally  IX,X: midline uvula rise  XI: bilateral shoulder shrug equal and strong XII: midline tongue extension  Motor:  5/5 in upper and lower extremities bilaterally including strong and equal grip strength and dorsiflexion/plantar flexion Sensory: Pinprick and light touch normal in all extremities.  Deep Tendon Reflexes: 2+ and symmetric  Cerebellar: normal finger-to-nose with bilateral upper extremities Gait: normal gait and balance CV: distal pulses palpable throughout   Skin: Skin is warm and dry. No rash noted. Pt is not diaphoretic.  Psychiatric: Pt has a normal mood and affect. Behavior is normal. Judgment and thought content normal.  Nursing note and vitals reviewed.  ED Results / Procedures / Treatments   Labs (all labs ordered are listed, but only abnormal results are displayed) Labs Reviewed - No data to display  EKG None  Radiology No results found.  Procedures Procedures    Medications Ordered in ED Medications - No data to display  ED Course/ Medical Decision Making/ A&P Clinical Course as of 10/23/22 1150  Mon Oct 23, 2022  1000 Initial impression: Patient without neurologic deficit on examination.Emergent considerations for headache include subarachnoid hemorrhage,  meningitis, temporal arteritis, glaucoma, cerebral ischemia, carotid/vertebral dissection, intracranial tumor, Venous sinus thrombosis, carbon monoxide poisoning, acute or chronic subdural hemorrhage.  Other considerations include: Migraine, Cluster headache, Hypertension, Caffeine, alcohol, or drug withdrawal, Pseudotumor cerebri, Arteriovenous malformation, Head injury, Neurocysticercosis, Post-lumbar puncture, Preeclampsia, Tension headache, Sinusitis, Cervical arthritis, Refractive error causing strain, Dental abscess, Otitis media, Temporomandibular joint syndrome, Depression, Somatoform disorder (eg, somatization) Trigeminal neuralgia, Glossopharyngeal neuralgia. I suspect this was likely a viral prodrome. She has no rashes suggestive of zoster outbreak, no tenderness over the greater or lesser occipital nerve that would suggest occipital neuralgia.  She has some spastic and tight neck tissue which is likely chronic. Plan labs, respiratory viral panel testing as this could be symptoms of influenza, fluids and half dose Reglan for nausea and headache.  Will obtain CT scan. [AH]  1036 WBC(!): 3.6 [AH]  1119 Lipase, blood [AH]  1119 Resp panel by RT-PCR (RSV, Flu A&B, Covid) Anterior Nasal Swab [AH]  1119 Comprehensive metabolic panel Patient's labs without significant finding [AH]  1119 CT Head Wo Contrast I visualized and interpreted CT head which shows no acute findings [AH]  1119 Discussed all findings with patient.  She had earlier refused Reglan but is now feeling  nauseous again.  Will give Reglan. [AH]    Clinical Course User Index [AH] Margarita Mail, PA-C                             Medical Decision Making Amount and/or Complexity of Data Reviewed Labs: ordered. Decision-making details documented in ED Course. Radiology: ordered. Decision-making details documented in ED Course.  Risk Prescription drug management.           Final Clinical Impression(s) / ED  Diagnoses Final diagnoses:  Bad headache  Nausea  Diarrhea, unspecified type    Rx / DC Orders ED Discharge Orders     None         Margarita Mail, PA-C 10/23/22 1151

## 2022-10-23 NOTE — ED Notes (Signed)
Pt in bed, pt states that she has 1/10 headache, pt states that she doesn't want anything for pain at this time.

## 2022-10-23 NOTE — ED Notes (Signed)
Pt in bed, family at bedside, pt states that she is ready to go home, pt reports 1/10 pain, pt verbalized understanding d/c and follow up, pt from dpt.

## 2022-10-23 NOTE — ED Triage Notes (Signed)
Pt arrive stop ED with c/o neck pain, diarrhea, and migraine that started on 2/8.

## 2022-10-23 NOTE — ED Provider Notes (Addendum)
[place] Provider Note   CSN: [id number] Arrival date & time: 10/23/22  0901     History  Chief Complaint  Patient presents with   Neck Pain   Diarrhea   Migraine     Samantha Yang is a 72 y.o. female who presents for evaluation of headache. Patient had onset of severe headache beginning 5 days ago.  She reports fairly sudden onset of occipital headache which she describes as sharp, severe, bilateral, left greater than right, nonradiating.  She has had some associated neck stiffness.  She took Tylenol without relief.  She states her headache went away and she was fine on Friday.  Saturday she woke up with the same headache but also had severe nausea and diarrhea along with retching.  She states that this lasted about 12 hours.  She was too weak to take care of herself and her kids came over to help.  She states that her diarrhea has slowed down and her headache improved.  She was feeling better on Sunday however had return of persistent, dull, 3 out of 10 posterior headache and has some mild nausea at this time.  She normally takes homeopathic medications for nausea.  She denies photophobia, phonophobia, vision change, unilateral weight weakness, difficulty with speech, changes in vision, ataxia or vertigo.  She had no fever.  She denies any neck stiffness at this time.  Neck Pain Diarrhea Migraine       Home Medications Prior to Admission medications   Medication Sig Start Date End Date Taking? Authorizing Provider  buPROPion (WELLBUTRIN XL) 150 MG 24 hr tablet Take 150 mg by mouth daily.    [provider]  ketorolac (TORADOL) 10 MG tablet Take 1 tablet (10 mg total) by mouth every 6 (six) hours as needed for pain. 08/12/12   Muthersbaugh, Jarrett Soho, PA-C  levothyroxine (SYNTHROID, LEVOTHROID) 112 MCG tablet Take 112 mcg by mouth daily before breakfast.    [provider]  Loratadine-Pseudoephedrine (CLARITIN-D 24 HOUR  PO) Take 1 tablet by mouth daily.    [provider]  methocarbamol (ROBAXIN) 500 MG tablet Take 1 tablet (500 mg total) by mouth 2 (two) times daily. Patient taking differently: Take 500 mg by mouth 2 (two) times daily as needed.  08/12/12   Muthersbaugh, Jarrett Soho, PA-C  mometasone (NASONEX) 50 MCG/ACT nasal spray Place 2 sprays into the nose daily.    [provider]  sertraline (ZOLOFT) 100 MG tablet Take 100 mg by mouth daily.    [provider]  Vitamin D, Ergocalciferol, (DRISDOL) 50000 UNITS CAPS capsule Take 50,000 Units by mouth every 7 (seven) days.    [provider]      Allergies    Prednisone, Acetaminophen, Amoxicillin, Amoxicillin-pot clavulanate, Codeine, Hydrocodone, Oxycodone, Oxycodone hcl, and Penicillins    Review of Systems   Review of Systems  Gastrointestinal:  Positive for diarrhea.  Musculoskeletal:  Positive for neck pain.    Physical Exam Updated Vital Signs BP (!) 145/98 (BP Location: Left Arm)   Pulse 91   Temp 97.9 F (36.6 C) (Oral)   Resp 16   Ht 5' (1.524 m)   Wt 65.8 kg   SpO2 99%   BMI 28.32 kg/m  Physical Exam  ED Results / Procedures / Treatments   Labs (all labs ordered are listed, but only abnormal results are displayed) Labs Reviewed  CBC WITH DIFFERENTIAL/PLATELET - Abnormal; Notable for the following components:  Result Value   WBC 3.6 (*)    All other components within normal limits  RESP PANEL BY RT-PCR (RSV, FLU A&B, COVID)  RVPGX2  COMPREHENSIVE METABOLIC PANEL  LIPASE, BLOOD    EKG None  Radiology CT Head Wo Contrast  Result Date: 10/23/2022 CLINICAL DATA:  Diarrhea. Neck pain. Migraine headache that began on 02/08. EXAM: CT HEAD WITHOUT CONTRAST TECHNIQUE: Contiguous axial images were obtained from the base of the skull through the vertex without intravenous contrast. RADIATION DOSE REDUCTION: This exam was performed according to the departmental dose-optimization program which  includes automated exposure control, adjustment of the mA and/or kV according to patient size and/or use of iterative reconstruction technique. COMPARISON:  Brain MRI 05/12/2015 FINDINGS: Brain: No evidence of acute infarction, hemorrhage, hydrocephalus, extra-axial collection or mass lesion/mass effect. Vascular: No hyperdense vessel or unexpected calcification. Skull: Normal. Negative for fracture or focal lesion. Sinuses/Orbits: The paranasal sinuses and mastoid air cells are clear. Other: None. IMPRESSION: No acute intracranial abnormalities. Electronically Signed   By: Kerby Moors M.D.   On: 10/23/2022 10:19    Procedures Procedures    Medications Ordered in ED Medications  metoCLOPramide (REGLAN) injection 5 mg (0 mg Intravenous Hold 10/23/22 1009)  sodium chloride 0.9 % bolus 1,000 mL ( Intravenous Canceled Entry 10/23/22 1030)    ED Course/ Medical Decision Making/ A&P Clinical Course as of 10/23/22 1120  Mon Oct 23, 2022  1000 Initial impression: Patient without neurologic deficit on examination.Emergent considerations for headache include subarachnoid hemorrhage, meningitis, temporal arteritis, glaucoma, cerebral ischemia, carotid/vertebral dissection, intracranial tumor, Venous sinus thrombosis, carbon monoxide poisoning, acute or chronic subdural hemorrhage.  Other considerations include: Migraine, Cluster headache, Hypertension, Caffeine, alcohol, or drug withdrawal, Pseudotumor cerebri, Arteriovenous malformation, Head injury, Neurocysticercosis, Post-lumbar puncture, Preeclampsia, Tension headache, Sinusitis, Cervical arthritis, Refractive error causing strain, Dental abscess, Otitis media, Temporomandibular joint syndrome, Depression, Somatoform disorder (eg, somatization) Trigeminal neuralgia, Glossopharyngeal neuralgia. I suspect this was likely a viral prodrome. She has no rashes suggestive of zoster outbreak, no tenderness over the greater or lesser occipital nerve that would  suggest occipital neuralgia.  She has some spastic and tight neck tissue which is likely chronic. Plan labs, respiratory viral panel testing as this could be symptoms of influenza, fluids and half dose Reglan for nausea and headache.  Will obtain CT scan. [AH]  1036 WBC(!): 3.6 [AH]  1119 Lipase, blood [AH]  1119 Resp panel by RT-PCR (RSV, Flu A&B, Covid) Anterior Nasal Swab [AH]  1119 Comprehensive metabolic panel Patient's labs without significant finding [AH]  1119 CT Head Wo Contrast I visualized and interpreted CT head which shows no acute findings [AH]  1119 Discussed all findings with patient.  She had earlier refused Reglan but is now feeling nauseous again.  Will give Reglan. [AH]    Clinical Course User Index [AH] Margarita Mail, PA-C                             Medical Decision Making 72 year old female here with complaint of headache and nausea and diarrhea.  Headache is currently 2 out of 10.  She does have some persistent nausea but no diarrhea.  After review of all data points I suspect she likely had a severe headache secondary to onset of viral illness. I do not believe the patient had symptoms of a migraine headache although her headache was very bad.  I do not have concerned that the patient has spontaneous or  traumatic vertebral artery dissection as she does not have room spinning dizziness, visual change, ataxia. I also do not believe the patient has symptoms of meningitis.  Plan is to discharge the patient home with symptomatic treatment and PCP follow-up.  Discussed outpatient follow-up and return precautions.  Amount and/or Complexity of Data Reviewed Labs: ordered. Decision-making details documented in ED Course. Radiology: ordered and independent interpretation performed. Decision-making details documented in ED Course.  Risk Prescription drug management.           Final Clinical Impression(s) / ED Diagnoses Final diagnoses:  None    Rx / DC  Orders ED Discharge Orders     None         Margarita Mail, PA-C 10/23/22 [place], [place], PA-C 10/23/22 1842    Regan Lemming, MD 10/23/22 1843

## 2022-10-23 NOTE — Discharge Instructions (Signed)

## 2023-11-05 DIAGNOSIS — N958 Other specified menopausal and perimenopausal disorders: Secondary | ICD-10-CM | POA: Insufficient documentation

## 2024-07-14 NOTE — Progress Notes (Unsigned)
 [facility]:    Date:  07/16/2024   ID:  Samantha Yang, DOB 1950/12/06, MRN [id number]  PCP:  Samantha Yang, Samantha Yang  Cardiologist:  New - Dr. Kate (DOD today) Click to update primary MD,subspecialty MD or APP then REFRESH:1}    Referring MD: Samantha Yang, Samantha Yang   Chief Complaint: fatigue   History of Present Illness:    Samantha Yang is a 73 y.o. female with a history of hyperlipidemia, hypothyroidism, fibromyalgia, vitamin B12 deficiency, vitamin D deficiency, and anxiety/ depression who presents today as a new patient in the [facility] for evaluation of fatigue.   Patient was recently seen by her PCP on 06/26/2024 and reported increased fatigue/ exercise tolerance. Her TSH was normal. Her Vitamin B12 was on the lower side but this was not felt to be the cause of her symptoms. She reported having a sleep study in the past that was reportedly normal. She was referred to Cardiology for further evaluation.   Patient reports progressive fatigue/exercise intolerance over the last 3-4 months and now states it is debilitating.  She has a long history of hypothyroidism but her Synthroid dose had to be decreased in April due to her thyroid level being too high.  It is now back in normal range and her PCP is following this closely.  She denies any significant fatigue during this time and states the fatigue started after.  She gets 8-9 hours of good sleep every night.  She states she used to be a morning person and would get straight out of bed without any issues and go on a 3 mile walk at a fast pace.  However, now she struggles to get out of bed in the morning and can only walk 1.5 miles due to significant fatigue/exhaustion.  She denies any chest pain or shortness of breath whatsoever.  Patient states she has always been incredibly active but now cannot due to this due to her fatigue which is concerning. She does report occasional snoring but states she had has had 2  sleep studies in the past which were both negative.  However, her last sleep study was several years ago.  Otherwise, no cardiac symptoms.   She reports some elevated BP readings on her home cuff recently. BP has been okay at her PCP's office, and there was concern that her home cuff was not accurate.  She did get a new cuff and it is running better in the 120-130s/low 80s. Otherwise, no cardiac issues.   She denies any history of heart disease.  Both her father and her brother had a CABG at the age of 28.  Her mother had a history of diabetes and CHF.  EKGs/Labs/Other Studies Reviewed:    The following studies were reviewed:  N/A  EKG:  EKG ordered today.  Recent EKG from PCPs office on 06/26/2024 showed normal sinus rhythm, right study beats minute, with no acute ST/T wave changes suggestive of ischemia.  Recent Labs: No results found for requested labs within last 365 days.  Recent Lipid Panel No results found for: CHOL, TRIG, HDL, CHOLHDL, VLDL, LDLCALC, LDLDIRECT  Physical Exam:    Vital Signs: BP 128/86   Pulse 82   Ht 5' (1.524 m)   Wt 146 lb 3.2 oz (66.3 kg)   SpO2 95%   BMI 28.55 kg/m     Wt Readings from Last 3 Encounters:  07/16/24 146 lb 3.2 oz (66.3 kg)  10/23/22 145 lb (  65.8 kg)  09/09/20 142 lb (64.4 kg)     General: 73 y.o. Caucasian female in no acute distress. HEENT: Normocephalic and atraumatic. Sclera clear.  Neck: Supple. No carotid bruits. No JVD. Heart: RRR. Distinct S1 and S2. No murmurs, gallops, or rubs.  Lungs: No increased work of breathing. Clear to ausculation bilaterally. No wheezes, rhonchi, or rales.  Extremities: No lower extremity edema.   Skin: Warm and dry. Neuro: No focal deficits. Psych: Normal affect. Responds appropriately.   Assessment:    1. Other fatigue   2. Decreased exercise tolerance   3. Family history of coronary artery disease   4. Hyperlipidemia, unspecified hyperlipidemia type     Plan:     Fatigue Decrease Exercise Tolerance Family History of CAD Patient reports progressive fatigue and decreased exercise tolerance over the last 3 to 4 months and now states it is debilitating.  She denies any chest pain or shortness of breath.  Recent EKG at PCPs office on 06/26/2024 showed normal sinus rhythm with no acute ischemic changes.  TSH was normal.  - She does have a family history of CAD with both her father and her brother requiring CABG at the age of 45 which is her current age.  Concerned that her symptoms could represent anginal equivalent.  Will order a coronary CTA for further evaluation.  Will provide a one-time dose of Lopressor 100 mg for patient to take 2 hours prior to CTA.  Renal function was normal on recent labs on 06/25/2024 in Care Everywhere so will not repeat BMET today. - She reports some occasoinal snoring. She has had 2 sleep studies int he past which were both negative but the last sleep study was several years ago. STOP BANG = 3 suggesting she is high risk for obstructive sleep study. Will order Itmar home sleep study. - If coronary CTA and sleep study are unremarkable, may need to consider assessing for chronotropic incompetence.   Hyperlipidemia Lipid panel in 10/2023: Total Cholesterol 253, Triglycerides 82, HDL 97, LDL 138.  -Not currently on any medications for this. Will wait on coronary CTA results before deciding on a statin.     Disposition: Follow up in 1 month.    Signed, Winona Sison E Tecla Mailloux, PA-C  07/16/2024 3:59 PM    [facility]

## 2024-07-16 ENCOUNTER — Ambulatory Visit: Attending: Student | Admitting: Student

## 2024-07-16 ENCOUNTER — Encounter: Payer: Self-pay | Admitting: Student

## 2024-07-16 VITALS — BP 128/86 | HR 82 | Ht 60.0 in | Wt 146.2 lb

## 2024-07-16 DIAGNOSIS — E785 Hyperlipidemia, unspecified: Secondary | ICD-10-CM | POA: Diagnosis not present

## 2024-07-16 DIAGNOSIS — R6889 Other general symptoms and signs: Secondary | ICD-10-CM | POA: Diagnosis not present

## 2024-07-16 DIAGNOSIS — Z8249 Family history of ischemic heart disease and other diseases of the circulatory system: Secondary | ICD-10-CM

## 2024-07-16 DIAGNOSIS — R5383 Other fatigue: Secondary | ICD-10-CM

## 2024-07-16 MED ORDER — METOPROLOL TARTRATE 100 MG PO TABS: 100.0000 mg | ORAL_TABLET | Freq: Once | ORAL | 0 refills | Status: DC

## 2024-07-16 NOTE — Patient Instructions (Signed)
 Medication Instructions:  Your physician recommends that you continue on your current medications as directed. Please refer to the Current Medication list given to you today.  *If you need a refill on your cardiac medications before your next appointment, please call your pharmacy*  Lab Work: None ordered  If you have labs (blood work) drawn today and your tests are completely normal, you will receive your results only by: MyChart Message (if you have MyChart) OR A paper copy in the mail If you have any lab test that is abnormal or we need to change your treatment, we will call you to review the results.  Testing/Procedures: Your physician has recommended that you have a sleep study. This test records several body functions during sleep, including: brain activity, eye movement, oxygen and carbon dioxide blood levels, heart rate and rhythm, breathing rate and rhythm, the flow of air through your mouth and nose, snoring, body muscle movements, and chest and belly movement.  This will come to your mailing address.  Can take up to 4-6 weeks.   Your physician has requested that you have cardiac CT. Cardiac computed tomography (CT) is a painless test that uses an x-ray machine to take clear, detailed pictures of your heart. For further information please visit [email or web address] Please follow instruction sheet  BELOW:    Your cardiac CT will be scheduled at one of the below locations:   [facility] [address] [place], [place] [address] [phone] (Severe contrast allergies only)  OR   [facility] [address] [place], [place] [address] [phone]  OR   [facility] [address] [place], [place] [address] [phone]  OR   [facility] and Vascular Tower [address]  [place], [place] [address]  OR   [facility]  [address] [place], [place] [phone]  If scheduled at [facility], please arrive at the [facility] and Children's Entrance (Entrance C2) of [facility] 30 minutes prior to test start time. You can use the FREE valet parking offered at entrance C (encouraged to control the heart rate for the test)  Proceed to the [facility] Radiology Department (first floor) to check-in and test prep.  All radiology patients and guests should use entrance C2 at [facility], accessed from [facility], even though the hospital's physical address listed is [address]  If scheduled at the Heart and Vascular Tower at [address], please enter the parking lot using the [address] entrance and use the FREE valet service at the patient drop-off area. Enter the building and check-in with registration on the main floor.  If scheduled at [facility], please arrive to the Heart and Vascular Center 15 mins early for check-in and test prep.  There is spacious parking and easy access to the radiology department from the [facility] Heart and Vascular entrance. Please enter here and check-in with the desk attendant.   If scheduled at [facility], please arrive 30 minutes early for check-in and test prep.  Please follow these instructions carefully (unless otherwise directed):  An IV will be required for this test and Nitroglycerin will be given.   On the Night Before the Test: Be sure to Drink plenty of water. Do not consume any caffeinated/decaffeinated beverages or chocolate 12 hours prior to your test. Do not take any antihistamines 12 hours prior to your test.   On the Day of  the Test: Drink plenty of water until 1 hour prior to the test. Do not eat any food 1 hour prior to test. You may take your regular medications prior to the test.  Take metoprolol (Lopressor) two hours prior to test. FEMALES- please wear underwire-free bra if available, avoid dresses & tight clothing       After the Test: Drink  plenty of water. After receiving IV contrast, you may experience a mild flushed feeling. This is normal. On occasion, you may experience a mild rash up to 24 hours after the test. This is not dangerous. If this occurs, you can take Benadryl 25 mg, Zyrtec, Claritin, or Allegra and increase your fluid intake. (Patients taking Tikosyn should avoid Benadryl, and may take Zyrtec, Claritin, or Allegra) If you experience trouble breathing, this can be serious. If it is severe call 911 IMMEDIATELY. If it is mild, please call our office.  We will call to schedule your test 2-4 weeks out understanding that some insurance companies will need an authorization prior to the service being performed.   For more information and frequently asked questions, please visit our website : [email or web address]  For non-scheduling related questions, please contact the cardiac imaging nurse navigator should you have any questions/concerns: Cardiac Imaging Nurse Navigators Direct Office Dial: [phone]   For scheduling needs, including cancellations and rescheduling, please call Brittany, [phone].     Follow-Up: At [facility], you and your health needs are our priority.  As part of our continuing mission to provide you with exceptional heart care, our providers are all part of one team.  This team includes your primary Cardiologist (physician) and Advanced Practice Providers or APPs (Physician Assistants and Nurse Practitioners) who all work together to provide you with the care you need, when you need it.  Your next appointment:   1 month(s)  Provider:   Callie Goodrich, PA-C          We recommend signing up for the patient portal called MyChart.  Sign up information is provided on this After Visit Summary.  MyChart is used to connect with patients for Virtual Visits (Telemedicine).  Patients are able to view lab/test results, encounter notes, upcoming appointments, etc.  Non-urgent  messages can be sent to your provider as well.   To learn more about what you can do with MyChart, go to [email or web address].   Other Instructions

## 2024-07-17 ENCOUNTER — Other Ambulatory Visit: Payer: Self-pay | Admitting: Student

## 2024-07-17 DIAGNOSIS — R5383 Other fatigue: Secondary | ICD-10-CM

## 2024-07-17 DIAGNOSIS — Z8249 Family history of ischemic heart disease and other diseases of the circulatory system: Secondary | ICD-10-CM

## 2024-07-18 ENCOUNTER — Other Ambulatory Visit: Payer: Self-pay | Admitting: Student

## 2024-07-18 DIAGNOSIS — R5383 Other fatigue: Secondary | ICD-10-CM

## 2024-07-18 DIAGNOSIS — Z8249 Family history of ischemic heart disease and other diseases of the circulatory system: Secondary | ICD-10-CM

## 2024-07-19 ENCOUNTER — Other Ambulatory Visit: Payer: Self-pay | Admitting: Student

## 2024-07-19 DIAGNOSIS — Z8249 Family history of ischemic heart disease and other diseases of the circulatory system: Secondary | ICD-10-CM

## 2024-07-19 DIAGNOSIS — R5383 Other fatigue: Secondary | ICD-10-CM

## 2024-07-20 ENCOUNTER — Other Ambulatory Visit: Payer: Self-pay | Admitting: Student

## 2024-07-20 DIAGNOSIS — R5383 Other fatigue: Secondary | ICD-10-CM

## 2024-07-20 DIAGNOSIS — Z8249 Family history of ischemic heart disease and other diseases of the circulatory system: Secondary | ICD-10-CM

## 2024-07-21 ENCOUNTER — Other Ambulatory Visit: Payer: Self-pay | Admitting: Student

## 2024-07-21 DIAGNOSIS — R5383 Other fatigue: Secondary | ICD-10-CM

## 2024-07-21 DIAGNOSIS — Z8249 Family history of ischemic heart disease and other diseases of the circulatory system: Secondary | ICD-10-CM

## 2024-07-22 ENCOUNTER — Other Ambulatory Visit: Payer: Self-pay | Admitting: Student

## 2024-07-22 DIAGNOSIS — Z8249 Family history of ischemic heart disease and other diseases of the circulatory system: Secondary | ICD-10-CM

## 2024-07-22 DIAGNOSIS — R5383 Other fatigue: Secondary | ICD-10-CM

## 2024-07-23 ENCOUNTER — Other Ambulatory Visit: Payer: Self-pay | Admitting: Student

## 2024-07-23 DIAGNOSIS — Z8249 Family history of ischemic heart disease and other diseases of the circulatory system: Secondary | ICD-10-CM

## 2024-07-23 DIAGNOSIS — R5383 Other fatigue: Secondary | ICD-10-CM

## 2024-07-31 ENCOUNTER — Encounter (HOSPITAL_COMMUNITY): Payer: Self-pay

## 2024-08-05 ENCOUNTER — Ambulatory Visit (HOSPITAL_COMMUNITY)
Admission: RE | Admit: 2024-08-05 | Discharge: 2024-08-05 | Disposition: A | Discharge status: Home / Self Care | Source: Ambulatory Visit | Attending: Student | Admitting: Student

## 2024-08-05 DIAGNOSIS — R931 Abnormal findings on diagnostic imaging of heart and coronary circulation: Secondary | ICD-10-CM | POA: Diagnosis not present

## 2024-08-05 DIAGNOSIS — R911 Solitary pulmonary nodule: Secondary | ICD-10-CM | POA: Diagnosis not present

## 2024-08-05 DIAGNOSIS — K449 Diaphragmatic hernia without obstruction or gangrene: Secondary | ICD-10-CM | POA: Diagnosis not present

## 2024-08-05 DIAGNOSIS — I700 Atherosclerosis of aorta: Secondary | ICD-10-CM | POA: Insufficient documentation

## 2024-08-05 DIAGNOSIS — Q2112 Patent foramen ovale: Secondary | ICD-10-CM | POA: Insufficient documentation

## 2024-08-05 DIAGNOSIS — I2510 Atherosclerotic heart disease of native coronary artery without angina pectoris: Secondary | ICD-10-CM | POA: Insufficient documentation

## 2024-08-05 DIAGNOSIS — Z136 Encounter for screening for cardiovascular disorders: Secondary | ICD-10-CM | POA: Insufficient documentation

## 2024-08-05 DIAGNOSIS — Z8249 Family history of ischemic heart disease and other diseases of the circulatory system: Secondary | ICD-10-CM | POA: Diagnosis present

## 2024-08-05 DIAGNOSIS — I3139 Other pericardial effusion (noninflammatory): Secondary | ICD-10-CM | POA: Diagnosis not present

## 2024-08-05 DIAGNOSIS — R5383 Other fatigue: Secondary | ICD-10-CM | POA: Diagnosis present

## 2024-08-05 MED ORDER — NITROGLYCERIN 0.4 MG SL SUBL
0.8000 mg | SUBLINGUAL_TABLET | Freq: Once | SUBLINGUAL | Status: AC
  Administered 2024-08-05: 0.8 mg via SUBLINGUAL

## 2024-08-05 MED ORDER — IOHEXOL 350 MG/ML SOLN
100.0000 mL | Freq: Once | INTRAVENOUS | Status: AC | PRN
  Administered 2024-08-05: 100 mL via INTRAVENOUS

## 2024-08-06 ENCOUNTER — Ambulatory Visit (HOSPITAL_COMMUNITY)
Admission: RE | Admit: 2024-08-06 | Discharge: 2024-08-06 | Disposition: A | Discharge status: Home / Self Care | Source: Ambulatory Visit | Attending: Cardiology | Admitting: Cardiology

## 2024-08-06 ENCOUNTER — Other Ambulatory Visit: Payer: Self-pay | Admitting: Cardiology

## 2024-08-06 DIAGNOSIS — R931 Abnormal findings on diagnostic imaging of heart and coronary circulation: Secondary | ICD-10-CM | POA: Diagnosis not present

## 2024-08-06 NOTE — Progress Notes (Signed)
 Cardiology Office Note:    Date:  08/14/2024   ID:  Samantha Yang, DOB 10-Oct-1950, MRN [id number]  PCP:  Lazoff, Shawn P, DO  Cardiologist:  Lonni LITTIE Nanas, MD Cardiology APP:  Arena Lindahl E, PA-C     Referring MD: Lazoff, Shawn P, DO   Chief Complaint: follow-up of fatigue/ exercise intolerance   History of Present Illness:    Samantha Yang is a 73 y.o. female with a history of non-obstructive CAD noted on recent coronary CTA on 08/05/2024, small pericardial effusion, PFO incidentally noted on coronary CTA, hyperlipidemia, hypothyroidism, pulmonary nodule, large hiatal hernia, fibromyalgia, vitamin B12 deficiency, vitamin D deficiency, and anxiety/ depression who presents today for follow-up of fatigue/ exercise intolerance.   Patient was recently seen by me on 07/16/2024 in the [facility] for further evaluation of progressive fatigue/ exercise intolerance that had become debilitating. She denied any chest pain or shortness of breath. She reported a family history of CAD with both her father and brother requiring a CABG around her age. Therefore, a coronary CTA was ordered for further evaluation and showed a coronary calcium score of 47.3 (49th percentile for age and sex) and mild to moderate non-obstructive CAD (FFR negative). CTA  incidentally showed a large hiatal hernia, patent foramen ovale, and a small circumferential pericardial effusion. Samantha Yang was also ordered at initial visit and is still pending.   Patient presents today for follow-up.  She is here with her son.  Her PCP started her on vitamin B12 and her fatigue has gotten a lot better with this.  She denies any other cardiac symptoms.  We reviewed her recent coronary CTA results in detail today.  ROS: No chest pain, shortness of breath, orthopnea, PND, lower extremity edema, palpitations, lightheadedness, dizziness, syncope.   EKGs/Labs/Other Studies Reviewed:    The following studies  were reviewed:  Coronary CTA 08/05/2024: Impression: 1. Coronary calcium score of 47.3. This was 49th percentile for age and sex matched control. 2. Normal coronary origins with right dominance. 3. CAD-RADS 3 Mild / Moderate CAD. 4. CT FFR will be performed and reported separately to further evaluate the ostial LAD finding. 5. Aortic atherosclerosis. 6. Circumferential pericardial effusion, predominantly small in size. Correlate with echocardiography. 7. Patent foramen ovale. 8. Large hiatal hernia.   EKG:  EKG not ordered today.   Recent Labs: No results found for requested labs within last 365 days.  Recent Lipid Panel No results found for: CHOL, TRIG, HDL, CHOLHDL, VLDL, LDLCALC, LDLDIRECT  Physical Exam:    Vital Signs: BP 136/84   Pulse 64   Ht 5' (1.524 m)   Wt 143 lb 12.8 oz (65.2 kg)   SpO2 99%   BMI 28.08 kg/m     Wt Readings from Last 3 Encounters:  08/14/24 143 lb 12.8 oz (65.2 kg)  07/16/24 146 lb 3.2 oz (66.3 kg)  10/23/22 145 lb (65.8 kg)     General: 73 y.o. Caucasian female in no acute distress. HEENT: Normocephalic and atraumatic. Sclera clear.  Neck: Supple. No carotid bruits. No JVD. Heart: RRR. Distinct S1 and S2. No murmurs, gallops, or rubs.  Lungs: No increased work of breathing. Clear to ausculation bilaterally. No wheezes, rhonchi, or rales.  Extremities: No lower extremity edema.   Skin: Warm and dry. Neuro: No focal deficits. Psych: Normal affect. Responds appropriately.   Assessment:    1. Non-obstructive CAD   2. Pericardial effusion   3. PFO (patent foramen ovale)  4. Hyperlipidemia, unspecified hyperlipidemia type   5. Other fatigue   6. Pre-op evaluation     Plan:    Non-Obstructive CAD Recent coronary CTA on 08/05/2024 for evaluation of progressive fatigue/ exercise tolerance showed a coronary calcium score of 47.3 (49th percentile for age and sex) and mild to moderate non-obstructive CAD (FFR  negative). - No chest pain.  - Will start Aspirin 81mg  daily and Crestor 20mg  daily.  Small Pericardial Effusion Recent coronary CTA in 07/2024 incidentally showed a small pericardial effusion.  - Will order an Echo for further evaluation.   PFO Recent coronary CTA in 07/2024 incidentally showed a PFO.  - Ordering an Echo for further evaluation of pericardial effusion but no indication for closure for PFO.   Hyperlipidemia Lipid panel in 10/2023: Total Cholesterol 253, Triglycerides 82, HDL 97, LDL 138. LDL goal <70. - Will start Crestor 20mg  daily.  - Will repeat lipid panel and LFTs in 3 months.  Fatigue Exercise Tolerance Patient has had progressive fatigue/ exercise intolerance over the last few months. TSH in 06/2024 was normal. Recent coronary CTA in 07/2024 showed non-obstructive CAD.  - She reports significant improvement in symptoms since PCP starting vitamin B12 supplement.  - Sleep Yang was ordered at last visit but she has not worn this yet.  Pre-Op Evaluation Patient mentioned that she needs surgery for her large hiatal hernia at some point in the near future.  Recent coronary CTA showed mild to moderate nonobstructive disease.  Will order an echo to further eval the small pericardial effusion noted on CTA.  If Echo is unremarkable, okay to proceed with surgery.  Disposition: Follow up in 6 months.   Signed, Aline FORBES Door, PA-C  08/14/2024 1:27 PM    [facility]

## 2024-08-07 ENCOUNTER — Ambulatory Visit: Payer: Self-pay | Admitting: Student

## 2024-08-09 ENCOUNTER — Ambulatory Visit: Payer: Self-pay | Admitting: Student

## 2024-08-14 ENCOUNTER — Encounter: Payer: Self-pay | Admitting: Student

## 2024-08-14 ENCOUNTER — Ambulatory Visit: Attending: Student | Admitting: Student

## 2024-08-14 VITALS — BP 136/84 | HR 64 | Ht 60.0 in | Wt 143.8 lb

## 2024-08-14 DIAGNOSIS — E785 Hyperlipidemia, unspecified: Secondary | ICD-10-CM | POA: Diagnosis not present

## 2024-08-14 DIAGNOSIS — I3139 Other pericardial effusion (noninflammatory): Secondary | ICD-10-CM

## 2024-08-14 DIAGNOSIS — I2510 Atherosclerotic heart disease of native coronary artery without angina pectoris: Secondary | ICD-10-CM | POA: Diagnosis not present

## 2024-08-14 DIAGNOSIS — R5383 Other fatigue: Secondary | ICD-10-CM

## 2024-08-14 DIAGNOSIS — Q2112 Patent foramen ovale: Secondary | ICD-10-CM

## 2024-08-14 DIAGNOSIS — Z01818 Encounter for other preprocedural examination: Secondary | ICD-10-CM

## 2024-08-14 MED ORDER — ASPIRIN 81 MG PO TBEC: 81.0000 mg | DELAYED_RELEASE_TABLET | Freq: Every day | ORAL | 3 refills | Status: AC

## 2024-08-14 MED ORDER — ROSUVASTATIN CALCIUM 20 MG PO TABS: 20.0000 mg | ORAL_TABLET | Freq: Every day | ORAL | 3 refills | Status: AC

## 2024-08-14 NOTE — Patient Instructions (Signed)
 Medication Instructions:  START: Aspirin 81 mg (1 tablet) daily  START: Crestor (Rosuvastatin) 20 mg (1 tablet) daily  *If you need a refill on your cardiac medications before your next appointment, please call your pharmacy*  Lab Work: In 3 months: LFTs, Lipids If you have labs (blood work) drawn today and your tests are completely normal, you will receive your results only by: MyChart Message (if you have MyChart) OR A paper copy in the mail If you have any lab test that is abnormal or we need to change your treatment, we will call you to review the results.  Testing/Procedures: Your physician has requested that you have an echocardiogram. Echocardiography is a painless test that uses sound waves to create images of your heart. It provides your doctor with information about the size and shape of your heart and how well your heart's chambers and valves are working. This procedure takes approximately one hour. There are no restrictions for this procedure. Please do NOT wear cologne, perfume, aftershave, or lotions (deodorant is allowed). Please arrive 15 minutes prior to your appointment time.  Please note: We ask at that you not bring children with you during ultrasound (echo/ vascular) testing. Due to room size and safety concerns, children are not allowed in the ultrasound rooms during exams. Our front office staff cannot provide observation of children in our lobby area while testing is being conducted. An adult accompanying a patient to their appointment will only be allowed in the ultrasound room at the discretion of the ultrasound technician under special circumstances. We apologize for any inconvenience.   Follow-Up: At [facility], you and your health needs are our priority.  As part of our continuing mission to provide you with exceptional heart care, our providers are all part of one team.  This team includes your primary Cardiologist (physician) and Advanced Practice  Providers or APPs (Physician Assistants and Nurse Practitioners) who all work together to provide you with the care you need, when you need it.  Your next appointment:   6 month(s)  Provider:   Callie Goodrich, PA-C

## 2024-09-25 ENCOUNTER — Ambulatory Visit (HOSPITAL_COMMUNITY)
Admission: RE | Admit: 2024-09-25 | Discharge: 2024-09-25 | Disposition: A | Discharge status: Home / Self Care | Source: Ambulatory Visit | Attending: Internal Medicine | Admitting: Internal Medicine

## 2024-09-25 DIAGNOSIS — I3139 Other pericardial effusion (noninflammatory): Secondary | ICD-10-CM | POA: Insufficient documentation

## 2024-09-25 LAB — ECHOCARDIOGRAM COMPLETE
Area-P 1/2: 3.27 cm2
S' Lateral: 2 cm

## 2024-09-28 ENCOUNTER — Ambulatory Visit: Payer: Self-pay | Admitting: Student

## 2024-09-28 DIAGNOSIS — I3139 Other pericardial effusion (noninflammatory): Secondary | ICD-10-CM

## 2024-09-29 NOTE — Telephone Encounter (Signed)
Addressed in other MyChart message from today

## 2024-10-29 ENCOUNTER — Ambulatory Visit (HOSPITAL_COMMUNITY)
